# Patient Record
Sex: Female | Born: 1965 | Race: White | Hispanic: No | Marital: Single | Smoking: Former smoker
Health system: Southern US, Community
[De-identification: ages and names within clinical notes are randomized; demographics above are authoritative.]

## PROBLEM LIST (undated history)

## (undated) DIAGNOSIS — IMO0002 Reserved for concepts with insufficient information to code with codable children: Secondary | ICD-10-CM

## (undated) DIAGNOSIS — T8859XA Other complications of anesthesia, initial encounter: Secondary | ICD-10-CM

## (undated) DIAGNOSIS — K219 Gastro-esophageal reflux disease without esophagitis: Secondary | ICD-10-CM

## (undated) DIAGNOSIS — T4145XA Adverse effect of unspecified anesthetic, initial encounter: Secondary | ICD-10-CM

## (undated) DIAGNOSIS — R490 Dysphonia: Secondary | ICD-10-CM

## (undated) DIAGNOSIS — I82622 Acute embolism and thrombosis of deep veins of left upper extremity: Secondary | ICD-10-CM

## (undated) DIAGNOSIS — M329 Systemic lupus erythematosus, unspecified: Secondary | ICD-10-CM

## (undated) DIAGNOSIS — H547 Unspecified visual loss: Secondary | ICD-10-CM

## (undated) HISTORY — DX: Gastro-esophageal reflux disease without esophagitis: K21.9

## (undated) HISTORY — DX: Dysphonia: R49.0

## (undated) HISTORY — DX: Acute embolism and thrombosis of deep veins of left upper extremity: I82.622

---

## 2004-06-18 ENCOUNTER — Inpatient Hospital Stay (HOSPITAL_COMMUNITY): Admission: AD | Admit: 2004-06-18 | Discharge: 2004-06-18 | Payer: Self-pay | Admitting: Obstetrics & Gynecology

## 2004-06-22 ENCOUNTER — Inpatient Hospital Stay (HOSPITAL_COMMUNITY): Admission: AD | Admit: 2004-06-22 | Discharge: 2004-06-24 | Payer: Self-pay | Admitting: Obstetrics and Gynecology

## 2005-10-25 ENCOUNTER — Other Ambulatory Visit: Admission: RE | Admit: 2005-10-25 | Discharge: 2005-10-25 | Payer: Self-pay | Admitting: Obstetrics and Gynecology

## 2006-12-29 ENCOUNTER — Emergency Department (HOSPITAL_COMMUNITY): Admission: EM | Admit: 2006-12-29 | Discharge: 2006-12-29 | Payer: Self-pay | Admitting: Emergency Medicine

## 2011-11-26 ENCOUNTER — Ambulatory Visit (INDEPENDENT_AMBULATORY_CARE_PROVIDER_SITE_OTHER): Payer: BC Managed Care – PPO | Admitting: Internal Medicine

## 2011-11-26 ENCOUNTER — Encounter: Payer: Self-pay | Admitting: Internal Medicine

## 2011-11-26 VITALS — BP 110/80 | HR 97 | Temp 97.0°F | Ht 70.0 in | Wt 242.0 lb

## 2011-11-26 DIAGNOSIS — R059 Cough, unspecified: Secondary | ICD-10-CM

## 2011-11-26 DIAGNOSIS — R05 Cough: Secondary | ICD-10-CM

## 2011-11-26 LAB — PULMONARY FUNCTION TEST

## 2011-11-26 MED ORDER — PREDNISONE (PAK) 10 MG PO TABS: ORAL_TABLET | ORAL | Status: AC

## 2011-11-26 MED ORDER — ESOMEPRAZOLE MAGNESIUM 40 MG PO CPDR: 40.0000 mg | DELAYED_RELEASE_CAPSULE | Freq: Every day | ORAL | Status: DC

## 2011-11-26 MED ORDER — TRAMADOL HCL 50 MG PO TABS: ORAL_TABLET | ORAL | Status: AC

## 2011-11-26 NOTE — Progress Notes (Signed)
PFT done today. 

## 2011-11-26 NOTE — Progress Notes (Signed)
  Subjective:    Patient ID: Shelley Reese, female    DOB: 04/02/1966, 45 y.o.   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  11/26/2011  1st pulmonary evaluation for recurrent wheezing with colds  Onset in the 1990's rx with prednisone for maybe 10days,  No maint rx maybe a couple times a year plus abx and cough meds mostly in winter then moved to [place] in 2005 same problem s prednisone because wasn't wheezing but present exacerbation of cough started early November 2012 rx with advair / albuterol  p  "caught daughter's cold" zpak and levaquin never prednsione and never better.   Cough is daily,  dry,  More when  supine but also triggered   at [employer].  Started Zegrid otc no better.   Assoc with sense of nasal congestion but not itching or sneezing. Assoc with sence of throat fullness/ mild dysphagia. Not sob.    Review of Systems  Constitutional: Negative for fever, chills and unexpected weight change.  HENT: Positive for congestion, rhinorrhea, trouble swallowing and sinus pressure. Negative for ear pain, nosebleeds, sore throat, sneezing, dental problem, voice change and postnasal drip.   Eyes: Negative for visual disturbance.  Respiratory: Positive for cough. Negative for choking and shortness of breath.   Cardiovascular: Negative for chest pain and leg swelling.  Gastrointestinal: Negative for vomiting, abdominal pain and diarrhea.  Genitourinary: Negative for difficulty urinating.  Musculoskeletal: Positive for arthralgias.  Skin: Negative for rash.  Neurological: Negative for tremors, syncope and headaches.  Hematological: Does not bruise/bleed easily.       Objective:   Physical Exam  amb tense wf nad Wt 242 11/26/11 HEENT: nl dentition, turbinates, and orophanx.  Nl external ear canals without cough reflex   NECK :  without JVD/Nodes/TM/ nl carotid upstrokes bilaterally   LUNGS: no acc muscle use, clear to A and P bilaterally without cough on insp or exp maneuvers   CV:  RRR  no s3 or murmur or increase in P2, no edema   ABD:  soft and nontender with nl excursion in the supine position. No bruits or organomegaly, bowel sounds nl  MS:  warm without deformities, calf tenderness, cyanosis or clubbing  SKIN: warm and dry without lesions    NEURO:  alert, approp, no deficits   Outside cxr not available,  requested      Assessment & Plan:

## 2011-11-26 NOTE — Patient Instructions (Signed)
The key to effective treatment for your cough is eliminating the non-stop cycle of cough you're stuck in long enough to let your airway heal completely and then see if there is anything still making you cough once you stop the cough suppression, but this should take no more than 5 days to figuure out  First take delsym two tsp every 12 hours and supplement if needed with  tramadol 50 mg up to 2 every 4 hours to suppress the urge to cough. Swallowing water or using ice chips/non mint and menthol containing candies (such as lifesavers or sugarless jolly ranchers) are also effective.  You should rest your voice and avoid activities that you know make you cough.  Once you have eliminated the cough for 3 straight days try reducing the tramadol first,  then the delsym as tolerated.    Try Nexium 40  Take 30-60 min before first meal of the day and Zergrid 20 mg one bedtime until cough is completely gone for at least a week without the need for cough suppression  I think of reflux for chronic cough like I do oxygen for fire (doesn't cause the fire but once you get the oxygen suppressed it usually goes away regardless of the exact cause).  GERD (REFLUX)  is an extremely common cause of respiratory symptoms, many times with no significant heartburn at all.    It can be treated with medication, but also with lifestyle changes including avoidance of late meals, excessive alcohol, smoking cessation, and avoid fatty foods, chocolate, peppermint, colas, red wine, and acidic juices such as orange juice.  NO MINT OR MENTHOL PRODUCTS SO NO COUGH DROPS  USE SUGARLESS CANDY INSTEAD (jolley ranchers or Stover's)  NO OIL BASED VITAMINS - use powdered substitutes.    Prednisone 10 mg take  4 each am x 2 days,   2 each am x 2 days,  1 each am x2days and stop    Please schedule a follow up office visit in 2 weeks, sooner if needed with cxr in hand

## 2011-11-27 DIAGNOSIS — J45991 Cough variant asthma: Secondary | ICD-10-CM | POA: Insufficient documentation

## 2011-11-27 NOTE — Assessment & Plan Note (Addendum)
The most common causes of chronic cough in immunocompetent adults include the following: upper airway cough syndrome (UACS), previously referred to as postnasal drip syndrome (PNDS), which is caused by variety of rhinosinus conditions; (2) asthma; (3) GERD; (4) chronic bronchitis from cigarette smoking or other inhaled environmental irritants; (5) nonasthmatic eosinophilic bronchitis; and (6) bronchiectasis.   These conditions, singly or in combination, have accounted for up to 94% of the causes of chronic cough in prospective studies.   Other conditions have constituted no >6% of the causes in prospective studies These have included bronchogenic carcinoma, chronic interstitial pneumonia, sarcoidosis, left ventricular failure, ACEI-induced cough, and aspiration from a condition associated with pharyngeal dysfunction.  Of the three most common causes of chronic cough, only one (GERD)  can actually cause the other two (asthma and post nasal drip syndrome)  and perpetuate the cylce of cough inducing airway trauma, inflammation, heightened sensitivity to reflux which is prompted by the cough itself via a cyclical mechanism.    This may partially respond to steroids and look like asthma and post nasal drainage but never erradicated completely unless the cough and the secondary reflux are eliminated, preferably both at the same time.  While not intuitively obvious, many patients with chronic low grade reflux do not cough until there is a secondary insult that disturbs the protective epithelial barrier and exposes sensitive nerve endings.  This can be viral or direct physical injury such as with an endotracheal tube.   The point is that once this occurs, it is difficult to eliminate using anything but a maximally effective acid suppression regimen at least in the short run, accompanied by an appropriate diet to address non acid GERD.   See instructions for specific recommendations which were reviewed directly  with the patient who was given a copy with highlighter outlining the key components.   Very strongly doubt this is related to 9/11 based on the pattern of episodic nature of complaints that pre-dated 9/11 and didn't really change pattern afterwards

## 2011-12-06 ENCOUNTER — Encounter: Payer: Self-pay | Admitting: Internal Medicine

## 2011-12-07 ENCOUNTER — Encounter: Payer: Self-pay | Admitting: Internal Medicine

## 2011-12-10 ENCOUNTER — Ambulatory Visit (INDEPENDENT_AMBULATORY_CARE_PROVIDER_SITE_OTHER): Payer: BC Managed Care – PPO | Admitting: Internal Medicine

## 2011-12-10 ENCOUNTER — Encounter: Payer: Self-pay | Admitting: Internal Medicine

## 2011-12-10 VITALS — BP 150/88 | HR 75 | Temp 98.7°F | Ht 70.0 in | Wt 229.0 lb

## 2011-12-10 DIAGNOSIS — R059 Cough, unspecified: Secondary | ICD-10-CM

## 2011-12-10 DIAGNOSIS — R05 Cough: Secondary | ICD-10-CM

## 2011-12-10 NOTE — Patient Instructions (Addendum)
Consider stopping your hormone replacement for a month since this can contribute to GERD  Definitely continue nexium 40 mg Take 30-60 min before first meal of the day and Zegerid at bedtime for at least 3 months then defer to Dr Renae Gloss regarding longterm medical therapy or referral to a GI doctor   If the cough continues to be a problem next step asthma challenge ideally should not be done while on hormone replacement therapy because it causes false positives (as does reflux) but if you want to have this done call [facility] and schedule it at [phone].   Cxr should probably be repeated in a year unless symptoms worsen- return here if this is the case (exposure to 9/11 dust but no definite sequelae

## 2011-12-10 NOTE — Progress Notes (Signed)
Subjective:    Patient ID: Shelley Reese, female    DOB: Sep 26, 1966, 46 y.o.   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  11/26/2011  1st pulmonary evaluation for recurrent wheezing with colds  Onset in the 1990's rx with prednisone for maybe 10days,  No maint rx maybe a couple times a year plus abx and cough meds mostly in winter then moved to [place] in 2005 same problem s prednisone because wasn't wheezing but present exacerbation of cough started early November 2012 rx with advair / albuterol  p  "caught daughter's cold" zpak and levaquin never prednsione and never better.   Cough is daily,  dry,  More when  supine but also triggered   at [employer].  Started Zegrid otc no better.   Assoc with sense of nasal congestion but not itching or sneezing. Assoc with sence of throat fullness/ mild dysphagia. Not sob.    rec The key to effective treatment for your cough is eliminating the non-stop cycle of cough you're stuck in long enough to let your airway heal completely and then see if there is anything still making you cough once you stop the cough suppression, but this should take no more than 5 days to figuure out  First take delsym two tsp every 12 hours and supplement if needed with  tramadol 50 mg up to 2 every 4 hours to suppress the urge to cough..   Try Nexium 40  Take 30-60 min before first meal of the day and Zergrid 20 mg one bedtime until cough is completely gone for at least a week without the need for cough suppression Prednisone 10 mg take  4 each am x 2 days,   2 each am x 2 days,  1 each am x2days and stop       12/10/2011 f/u ov/[facility] cc cough much better, no use of suppression, still cough on exp to heavy fumes but this was true before Nov  2012 so feels completely  back to baseline now with no need for cough suppression, no sob  Sleeping ok without nocturnal  or early am exacerbation  of respiratory  c/o's or need for noct saba. Also denies any obvious fluctuation of symptoms with weather or environmental changes or other aggravating or alleviating factors except as outlined above   ROS  At present neg for  any significant sore throat, dysphagia, itching, sneezing,  nasal congestion or excess/ purulent secretions,  fever, chills, sweats, unintended wt loss, pleuritic or exertional cp, hempoptysis, orthopnea pnd or leg swelling.  Also denies presyncope, palpitations, heartburn, abdominal pain, nausea, vomiting, diarrhea  or change in bowel or urinary habits, dysuria,hematuria,  rash, arthralgias, visual complaints, headache, numbness weakness or ataxia.         Objective:   Physical Exam  amb tense somber wf nad Wt 242 11/26/11 >  12/10/2011  HEENT: nl dentition, turbinates, and orophanx. Nl external ear canals without cough reflex   NECK :  without JVD/Nodes/TM/ nl carotid upstrokes bilaterally   LUNGS: no acc muscle use, clear to A and P bilaterally without cough on insp or exp maneuvers   CV:  RRR  no s3 or murmur or increase in P2, no edema  ABD:  soft and nontender with nl excursion in the supine position. No bruits or organomegaly, bowel sounds nl  MS:  warm without deformities, calf tenderness, cyanosis or clubbing  SKIN: warm and dry without lesions         CXR 10/23/11 Minimal increase in markings ? significant      Assessment & Plan:

## 2011-12-11 NOTE — Assessment & Plan Note (Addendum)
-   Exposed to 08/16/00 dust but no chronic symptoms   - PFT's 11/26/11 WNL x dlco 69 % corrects to 123  Resolved at present so probably developed a typical cyclical cough as a result of what should have been just a viral uri  Explained natural history of uri and why it's necessary in patients at risk to treat GERD aggressively  at least  short term   to reduce risk of evolving cyclical cough initially  triggered by epithelial injury and a heightened sensitivty to the effects of any upper airway irritants,  most importantly acid - related.  That is, the more sensitive the epithelium damaged for virus, the more the cough, the more the secondary reflux (especially in those prone to reflux) the more the irritation of the sensitive mucosa and so on in a cyclical pattern.   No further w/u needed

## 2011-12-29 ENCOUNTER — Encounter: Payer: Self-pay | Admitting: Internal Medicine

## 2012-12-15 ENCOUNTER — Other Ambulatory Visit (HOSPITAL_COMMUNITY): Payer: Self-pay | Admitting: Cardiovascular Disease

## 2012-12-15 DIAGNOSIS — R079 Chest pain, unspecified: Secondary | ICD-10-CM

## 2012-12-15 DIAGNOSIS — R0600 Dyspnea, unspecified: Secondary | ICD-10-CM

## 2012-12-19 ENCOUNTER — Ambulatory Visit (HOSPITAL_COMMUNITY)
Admission: RE | Admit: 2012-12-19 | Discharge: 2012-12-19 | Disposition: A | Discharge status: Home / Self Care | Payer: BC Managed Care – PPO | Source: Ambulatory Visit | Attending: Cardiovascular Disease | Admitting: Cardiovascular Disease

## 2012-12-19 DIAGNOSIS — R0600 Dyspnea, unspecified: Secondary | ICD-10-CM

## 2012-12-19 DIAGNOSIS — R079 Chest pain, unspecified: Secondary | ICD-10-CM | POA: Insufficient documentation

## 2012-12-19 DIAGNOSIS — R0989 Other specified symptoms and signs involving the circulatory and respiratory systems: Secondary | ICD-10-CM | POA: Insufficient documentation

## 2012-12-19 DIAGNOSIS — R0609 Other forms of dyspnea: Secondary | ICD-10-CM | POA: Insufficient documentation

## 2012-12-19 MED ORDER — REGADENOSON 0.4 MG/5ML IV SOLN
0.4000 mg | Freq: Once | INTRAVENOUS | Status: AC
  Administered 2012-12-19: 0.4 mg via INTRAVENOUS

## 2012-12-19 MED ORDER — TECHNETIUM TC 99M SESTAMIBI GENERIC - CARDIOLITE
31.0000 | Freq: Once | INTRAVENOUS | Status: AC | PRN
  Administered 2012-12-19: 31 via INTRAVENOUS

## 2012-12-19 MED ORDER — TECHNETIUM TC 99M SESTAMIBI GENERIC - CARDIOLITE
10.4000 | Freq: Once | INTRAVENOUS | Status: AC | PRN
  Administered 2012-12-19: 10 via INTRAVENOUS

## 2012-12-19 NOTE — Procedures (Addendum)
[facility] [address] [place] [address] [place] [address] [phone]  Cardiology Nuclear Med Study  Shelley Reese is a 47 y.o. female     MRN : [id number]     DOB: July 07, 1966  Procedure Date: 12/19/2012  Nuclear Med Background Indication for Stress Test:  Evaluation for Ischemia History:  no prior cardiac history Cardiac Risk Factors: Family History - CAD, History of Smoking, Hypertension and Obesity  Symptoms:  Chest Pain and DOE   Nuclear Pre-Procedure Caffeine/Decaff Intake:  7:00pm NPO After: 5:00am   IV Site: R Antecubital  IV 0.9% NS with Angio Cath:  22g  Chest Size (in):  n/a IV Started by: Koren Shiver, CNMT  Height: 5\' 10"  (1.778 m)  Cup Size: c  BMI:  Body mass index is 33.86 kg/(m^2). Weight:  236 lb (107.049 kg)   Tech Comments:  n/a    Nuclear Med Study 1 or 2 day study: 1 day  Stress Test Type:  Lexiscan  Order Authorizing Provider:  Nanetta Batty, MD   Resting Radionuclide: Technetium 1m Sestamibi  Resting Radionuclide Dose: 10.4 mCi   Stress Radionuclide:  Technetium 54m Sestamibi  Stress Radionuclide Dose: 31.0 mCi           Stress Protocol Rest HR: 74 Stress HR: 115  Rest BP: 134/82 Stress BP: 146/87  Exercise Time (min): n/a METS: n/a   Predicted Max HR: 174 bpm % Max HR: 66.09 bpm Rate Pressure Product: 57846   Dose of Adenosine (mg):  n/a Dose of Lexiscan: 0.4 mg  Dose of Atropine (mg): n/a Dose of Dobutamine: n/a mcg/kg/min (at max HR)  Stress Test Technologist: Esperanza Sheets, CCT Nuclear Technologist: Gonzella Lex, CNMT   Rest Procedure:  Myocardial perfusion imaging was performed at rest 45 minutes following the intravenous administration of Technetium 73m Sestamibi. Stress Procedure:  The patient received IV Lexiscan 0.4 mg over 15-seconds.  Technetium 34m Sestamibi injected at 30-seconds.  There were no significant changes with Lexiscan.  Quantitative spect images were obtained after a  45 minute delay.  Transient Ischemic Dilatation (Normal <1.22):  0.87 Lung/Heart Ratio (Normal <0.45):  0.25 QGS EDV:  86 ml QGS ESV:  26 ml LV Ejection Fraction: 70%     Rest ECG: NSR - Normal EKG  Stress ECG: No significant change from baseline ECG  QPS Raw Data Images:  Normal; no motion artifact; normal heart/lung ratio. Stress Images:  Normal homogeneous uptake in all areas of the myocardium. Rest Images:  Normal homogeneous uptake in all areas of the myocardium. Subtraction (SDS):  No evidence of ischemia.  Impression Exercise Capacity:  Lexiscan with no exercise. BP Response:  Normal blood pressure response. Clinical Symptoms:  No significant symptoms noted. ECG Impression:  No significant ST segment change suggestive of ischemia. Comparison with Prior Nuclear Study: No significant change from previous study  Overall Impression:  Normal stress nuclear study.  LV Wall Motion:  NL LV Function; NL Wall Motion   Shelley Rushlow, MD  12/19/2012 12:39 PM

## 2013-04-07 ENCOUNTER — Encounter: Payer: Self-pay | Admitting: Cardiovascular Disease

## 2013-10-11 ENCOUNTER — Other Ambulatory Visit: Payer: Self-pay

## 2014-09-20 ENCOUNTER — Other Ambulatory Visit: Payer: Self-pay

## 2015-06-16 ENCOUNTER — Encounter: Payer: Self-pay | Admitting: *Deleted

## 2015-07-09 ENCOUNTER — Encounter: Payer: Self-pay | Admitting: Cardiovascular Disease

## 2015-07-23 ENCOUNTER — Other Ambulatory Visit: Payer: Self-pay | Admitting: Obstetrics and Gynecology

## 2015-07-23 DIAGNOSIS — R928 Other abnormal and inconclusive findings on diagnostic imaging of breast: Secondary | ICD-10-CM

## 2015-07-29 ENCOUNTER — Ambulatory Visit
Admission: RE | Admit: 2015-07-29 | Discharge: 2015-07-29 | Disposition: A | Discharge status: Home / Self Care | Payer: No Typology Code available for payment source | Source: Ambulatory Visit | Attending: Obstetrics and Gynecology | Admitting: Obstetrics and Gynecology

## 2015-07-29 DIAGNOSIS — R928 Other abnormal and inconclusive findings on diagnostic imaging of breast: Secondary | ICD-10-CM

## 2016-09-30 ENCOUNTER — Other Ambulatory Visit: Payer: Self-pay | Admitting: Family

## 2016-09-30 ENCOUNTER — Ambulatory Visit
Admission: RE | Admit: 2016-09-30 | Discharge: 2016-09-30 | Disposition: A | Discharge status: Home / Self Care | Payer: No Typology Code available for payment source | Source: Ambulatory Visit | Attending: Family | Admitting: Family

## 2016-09-30 DIAGNOSIS — R059 Cough, unspecified: Secondary | ICD-10-CM

## 2016-09-30 DIAGNOSIS — R05 Cough: Secondary | ICD-10-CM

## 2016-10-06 ENCOUNTER — Ambulatory Visit (INDEPENDENT_AMBULATORY_CARE_PROVIDER_SITE_OTHER): Payer: Self-pay | Admitting: Internal Medicine

## 2016-10-06 ENCOUNTER — Encounter: Payer: Self-pay | Admitting: Internal Medicine

## 2016-10-06 VITALS — BP 132/86 | HR 91 | Ht 68.5 in | Wt 220.0 lb

## 2016-10-06 DIAGNOSIS — R059 Cough, unspecified: Secondary | ICD-10-CM

## 2016-10-06 DIAGNOSIS — R05 Cough: Secondary | ICD-10-CM

## 2016-10-06 MED ORDER — CEFDINIR 300 MG PO CAPS: 300.0000 mg | ORAL_CAPSULE | Freq: Two times a day (BID) | ORAL | 0 refills | Status: DC

## 2016-10-06 NOTE — Progress Notes (Signed)
Subjective:    Patient ID: Shelley Reese, female    DOB: 07/25/1966,    MRN: [id number]  HPI   [age]  yowf TA/ kindergarden light smoker quit  2004   [place] no allergies or asthma occ bad cough about half the time needed to see doctor to get over it, referred by Dr [name] for eval of dust exp after 9/11 attacks in 2001 with - PFT's 11/26/11 WNL x dlco 69 % corrects to 123   11/26/2011  1st pulmonary evaluation for recurrent wheezing with colds  Onset in the 1990's rx with prednisone for maybe 10days,  No maint rx maybe a couple times a year plus abx and cough meds mostly in winter then moved to [place] in 2005 same problem s prednisone because wasn't wheezing but present exacerbation of cough started early November 2012 rx with advair / albuterol  p  "caught daughter's cold" zpak and levaquin never prednsione and never better.   Cough is daily,  dry,  More when  supine but also triggered   at [employer].  Started Zegrid otc no better.  Assoc with sense of nasal congestion but not itching or sneezing. Assoc with sence of throat fullness/ mild dysphagia. Not sob.  rec The key to effective treatment for your cough is eliminating the non-stop cycle of cough you're stuck in long enough to let your airway heal completely and then see if there is anything still making you cough once you stop the cough suppression, but this should take no more than 5 days to figuure out First take delsym two tsp every 12 hours and supplement if needed with  tramadol 50 mg up to 2 every 4 hours to suppress the urge to cough..   Try Nexium 40  Take 30-60 min before first meal of the day and Zergrid 20 mg one bedtime until cough is completely gone for at least a week without the need for cough suppression Prednisone 10 mg take  4 each am x 2 days,   2 each am x 2 days,  1 each am x2days and stop       12/10/2011 f/u ov/[facility] cc cough much better, no use of suppression, still cough on exp to heavy fumes but this  was true before Nov  2012 so feels completely back to baseline now with no need for cough suppression, no sob rec Consider stopping your hormone replacement for a month since this can contribute to GERD Definitely continue nexium 40 mg Take 30-60 min before first meal of the day and Zegerid at bedtime for at least 3 months then defer to Dr [name] regarding longterm medical therapy or referral to a GI doctor  If the cough continues to be a problem next step asthma challenge ideally should not be done while on hormone replacement therapy because it causes false positives (as does reflux) but if you want to have this done call [facility] and schedule it at [phone]> never done .        10/06/2016 1st [facility] office visit/ Shelley Reese   Chief Complaint  Patient presents with  . Pulmonary Consult    Referred by Boneta LucksJennifer Brown, PA. Pt c/o cough off and on for the past year. She states she has had PNA several times in 2017. She states her cough is occ prod with yellow to green sputum. She coughs until she vomits.   last abx x one month prior to OV then sick again with  severe cough / green mucus and cough to vomiting  Sob mostly when coughing/ her blue inhaler helps some (dulera)  No obvious day to day or daytime variability or assoc  mucus plugs or hemoptysis or cp or chest tightness, subjective wheeze or overt sinus or hb symptoms. No unusual exp hx or h/o childhood pna/ asthma or knowledge of premature birth.  Sleeping ok without nocturnal  or early am exacerbation  of respiratory  c/o's or need for noct saba. Also denies any obvious fluctuation of symptoms with weather or environmental changes or other aggravating or alleviating factors except as outlined above   Current Medications, Allergies, Complete Past Medical History, Past Surgical History, Family History, and Social History were reviewed in [facility] electronic medical record.      Review of Systems  Constitutional: Negative for  chills, fever and unexpected weight change.  HENT: Positive for sneezing, trouble swallowing and voice change. Negative for congestion, dental problem, ear pain, nosebleeds, postnasal drip, rhinorrhea, sinus pressure and sore throat.   Eyes: Negative for visual disturbance.  Respiratory: Positive for cough and shortness of breath. Negative for choking.   Cardiovascular: Positive for chest pain. Negative for leg swelling.  Gastrointestinal: Positive for vomiting. Negative for abdominal pain and diarrhea.  Genitourinary: Negative for difficulty urinating.  Musculoskeletal: Negative for arthralgias.  Skin: Negative for rash.  Neurological: Negative for tremors, syncope and headaches.  Hematological: Does not bruise/bleed easily.       Objective:   Physical Exam  Wt Readings from Last 3 Encounters:  10/06/16 220 lb (99.8 kg)  12/19/12 236 lb (107 kg)  12/10/11 229 lb (103.9 kg)    Vital signs reviewed - Note on arrival 02 sats  96% on RA    HEENT: nl dentition, turbinates, and oropharynx. Nl external ear canals without cough reflex   NECK :  without JVD/Nodes/TM/ nl carotid upstrokes bilaterally   LUNGS: no acc muscle use,  Nl contour chest with insp and exp sonorous rhonchi bilaterally   CV:  RRR  no s3 or murmur or increase in P2, no edema   ABD:  soft and nontender with nl inspiratory excursion in the supine position. No bruits or organomegaly, bowel sounds nl  MS:  Nl gait/ ext warm without deformities, calf tenderness, cyanosis or clubbing No obvious joint restrictions   SKIN: warm and dry without lesions    NEURO:  alert, approp, nl sensorium with  no motor deficits     I personally reviewed images and agree with radiology impression as follows:  CXR:   09/30/16  The heart size and mediastinal contours are within normal limits. There is no focal infiltrate, pulmonary edema, or pleural effusion. The visualized skeletal structures are unremarkable.         Assessment & Plan:

## 2016-10-06 NOTE — Patient Instructions (Addendum)
omnicef 300 mg one twice daily x 10 days   Best cough med  > mucined dm up to 1200 mg every 12 hours as needed  For wheeze > dulera up to 2 puff every 12 hours as needed   GERD (REFLUX)  is an extremely common cause of respiratory symptoms just like yours , many times with no obvious heartburn at all.    It can be treated with medication, but also with lifestyle changes including elevation of the head of your bed (ideally with 6 inch  bed blocks),  Smoking cessation, avoidance of late meals, excessive alcohol, and avoid fatty foods, chocolate, peppermint, colas, red wine, and acidic juices such as orange juice.  NO MINT OR MENTHOL PRODUCTS SO NO COUGH DROPS  USE SUGARLESS CANDY INSTEAD (Jolley ranchers or Stover's or Life Savers) or even ice chips will also do - the key is to swallow to prevent all throat clearing. NO OIL BASED VITAMINS - use powdered substitutes.  After the first of the year you will need a sinus ct and high resolution ct chest - call to schedule  Also need full pneumonia vaccination per your primary Care provider

## 2016-10-07 NOTE — Assessment & Plan Note (Signed)
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 and would benefit from full immunization for flu/ pneumovax but defer this to [facility]ennifer Brown  She needs a w/u with CT sinus and HRCT chest (looking from bronchiectasis) but has no insurance and this can wait until after the first of the year when she will  In meantime rec omnicef/ mucinex dm/ dulera 2 bid until better then try off   Total time devoted to counseling  = 20/5640m review case with pt/ discussion of options/alternatives/ personally creating written instructions  in presence of pt  then going over those specific  Instructions directly with the pt including how to use all of the meds but in particular covering each new medication in detail and the difference between the maintenance/automatic meds and the prns using an action plan format for the latter.

## 2017-01-10 ENCOUNTER — Ambulatory Visit (INDEPENDENT_AMBULATORY_CARE_PROVIDER_SITE_OTHER): Payer: BC Managed Care – PPO | Admitting: Oncology

## 2017-01-10 ENCOUNTER — Encounter: Payer: Self-pay | Admitting: Oncology

## 2017-01-10 VITALS — BP 131/82 | HR 86 | Temp 97.8°F | Ht 69.5 in | Wt 219.8 lb

## 2017-01-10 DIAGNOSIS — Z87891 Personal history of nicotine dependence: Secondary | ICD-10-CM

## 2017-01-10 DIAGNOSIS — Z820 Family history of epilepsy and other diseases of the nervous system: Secondary | ICD-10-CM | POA: Diagnosis not present

## 2017-01-10 DIAGNOSIS — D649 Anemia, unspecified: Secondary | ICD-10-CM

## 2017-01-10 DIAGNOSIS — Z800 Family history of malignant neoplasm of digestive organs: Secondary | ICD-10-CM

## 2017-01-10 DIAGNOSIS — Z801 Family history of malignant neoplasm of trachea, bronchus and lung: Secondary | ICD-10-CM

## 2017-01-10 DIAGNOSIS — D72829 Elevated white blood cell count, unspecified: Secondary | ICD-10-CM

## 2017-01-10 DIAGNOSIS — I82622 Acute embolism and thrombosis of deep veins of left upper extremity: Secondary | ICD-10-CM | POA: Insufficient documentation

## 2017-01-10 DIAGNOSIS — Z8249 Family history of ischemic heart disease and other diseases of the circulatory system: Secondary | ICD-10-CM

## 2017-01-10 DIAGNOSIS — Z881 Allergy status to other antibiotic agents status: Secondary | ICD-10-CM

## 2017-01-10 DIAGNOSIS — R634 Abnormal weight loss: Secondary | ICD-10-CM | POA: Diagnosis not present

## 2017-01-10 DIAGNOSIS — Z807 Family history of other malignant neoplasms of lymphoid, hematopoietic and related tissues: Secondary | ICD-10-CM

## 2017-01-10 DIAGNOSIS — R490 Dysphonia: Secondary | ICD-10-CM

## 2017-01-10 DIAGNOSIS — Z803 Family history of malignant neoplasm of breast: Secondary | ICD-10-CM

## 2017-01-10 DIAGNOSIS — Z8269 Family history of other diseases of the musculoskeletal system and connective tissue: Secondary | ICD-10-CM | POA: Diagnosis not present

## 2017-01-10 DIAGNOSIS — R05 Cough: Secondary | ICD-10-CM | POA: Diagnosis not present

## 2017-01-10 DIAGNOSIS — Z836 Family history of other diseases of the respiratory system: Secondary | ICD-10-CM

## 2017-01-10 DIAGNOSIS — Z808 Family history of malignant neoplasm of other organs or systems: Secondary | ICD-10-CM

## 2017-01-10 HISTORY — DX: Dysphonia: R49.0

## 2017-01-10 HISTORY — DX: Acute embolism and thrombosis of deep veins of left upper extremity: I82.622

## 2017-01-10 LAB — CBC WITH DIFFERENTIAL/PLATELET
BASOS PCT: 1 %
Basophils Absolute: 0.1 10*3/uL (ref 0.0–0.1)
EOS ABS: 0.3 10*3/uL (ref 0.0–0.7)
Eosinophils Relative: 2 %
HEMATOCRIT: 37.7 % (ref 36.0–46.0)
Hemoglobin: 12 g/dL (ref 12.0–15.0)
Lymphocytes Relative: 23 %
Lymphs Abs: 2.4 10*3/uL (ref 0.7–4.0)
MCH: 26 pg (ref 26.0–34.0)
MCHC: 31.8 g/dL (ref 30.0–36.0)
MCV: 81.6 fL (ref 78.0–100.0)
MONO ABS: 0.7 10*3/uL (ref 0.1–1.0)
MONOS PCT: 7 %
NEUTROS ABS: 7.2 10*3/uL (ref 1.7–7.7)
Neutrophils Relative %: 67 %
Platelets: 393 10*3/uL (ref 150–400)
RBC: 4.62 MIL/uL (ref 3.87–5.11)
RDW: 14.2 % (ref 11.5–15.5)
WBC: 10.7 10*3/uL — ABNORMAL HIGH (ref 4.0–10.5)

## 2017-01-10 LAB — COMPREHENSIVE METABOLIC PANEL
ALBUMIN: 4.1 g/dL (ref 3.5–5.0)
ALK PHOS: 71 U/L (ref 38–126)
ALT: 16 U/L (ref 14–54)
AST: 17 U/L (ref 15–41)
Anion gap: 10 (ref 5–15)
BUN: 6 mg/dL (ref 6–20)
CALCIUM: 9.3 mg/dL (ref 8.9–10.3)
CO2: 26 mmol/L (ref 22–32)
CREATININE: 0.65 mg/dL (ref 0.44–1.00)
Chloride: 103 mmol/L (ref 101–111)
GFR calc Af Amer: >60 mL/min (ref >60)
GFR calc non Af Amer: >60 mL/min (ref >60)
GLUCOSE: 94 mg/dL (ref 65–99)
Potassium: 3.7 mmol/L (ref 3.5–5.1)
SODIUM: 139 mmol/L (ref 135–145)
Total Bilirubin: 0.5 mg/dL (ref 0.3–1.2)
Total Protein: 7.3 g/dL (ref 6.5–8.1)

## 2017-01-10 LAB — LACTATE DEHYDROGENASE: LDH: 112 U/L (ref 98–192)

## 2017-01-10 LAB — D-DIMER, QUANTITATIVE: D-Dimer, Quant: <0.27 ug/mL-FEU (ref 0.00–0.50)

## 2017-01-10 NOTE — Patient Instructions (Signed)
Pre-authorization then schedule CT chest/venogram Referral to ENT Dr Narda Bondshris Newman to evaluate hoarse voice Continue Xarelto  Return visit 4-6 weeks

## 2017-01-10 NOTE — Progress Notes (Signed)
New Patient Hematology   Shelley Reese [id number] 1965/12/10 51 y.o. 01/10/2017  CC: Boneta Lucks, PA, Casper Harrison; Dr. Sandrea Hughs; Dr. Carrington Clamp; Dr. Charna Elizabeth   Reason for referral: Evaluate unprovoked extensive left upper extremity DVT   HPI:  51 year old woman who grew up in [place] then moved to [place], [place] where she had a successful career as a Materials engineer. She went to high school with Dr.Aluisio. 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 Right around Thanksgiving, she developed acute onset of pain after lifting up a school desk which had fallen over. Pain persisted and then her left upper arm began to swell. Her primary caregiver evaluated her and was concerned that she may have had a DVT. She was sent for Doppler study on 11/04/2016 which showed extensive DVT in the left venous system including the basilic, cephalic, brachial, subclavian, and internal jugular veins. She was started on several to anticoagulation. She reports coincidentally feeling much better overall. She states that her menstrual cycle was heavy the first month on the Xarelto but now it has stopped completely. She has had a chronic cough, worse in the supine position, some of this has been attributed to toxic dust inhalation since she was working in [place] at the time of the [employer]. She has been evaluated by Dr. Sandrea Hughs, pulmonology. He saw her in November 2017. Chest x-ray done 09/30/2016 was normal. He planned to get a CT scan early this year when the patient got her medical insurance back. Over the last year she has noted a change in her voice with increasing hoarseness. She does have a history of reflux esophagitis.  In view of a strong family history of colon cancer, she has been followed closely and has had 3 colonoscopies to date the last one done about 3 years ago. She also had upper endoscopy with the  biopsy? Excision? Of an abnormality in her esophagus. She has a follow-up study scheduled for this April. She denies any current dysphagia. She does report a 20 pound weight loss over the last 3-4 months. Last mammogram was done 07/29/2015. A "intra-mammary/low axillary lymph node" seen in the left breast but not felt to be malignant appearing and an annual follow-up study was recommended. She had a breast biopsy on the right side 18 years ago which was benign. She was recently evaluated for polymyalgias. She was concerned in view of her mother's history of severe lupus. She did see a rheumatologist and was told that she had rheumatoid arthritis.  She was on an estrogen containing oral contraceptive for over 10 years to help regulate her menses. This was stopped at the time of the recent DVT.  Her father is still alive at age 71. He has Paget's disease. No clotting problems. Her mother died at age 73. 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 Her mother had a history of DVTs and possibly pulmonary emboli and may have had a caval filter placed. She has a sister in [place] and a brother and [place] both younger than her who are healthy and have had no clotting issues.    PMH: Past Medical History:  Diagnosis Date  . Acute deep vein thrombosis (DVT) of ulnar vein of left upper extremity (HCC) 01/10/2017   11/04/16 all veins from  basilic/cephalic up through internal jugular; unprovoked  . Asthma   . GERD (gastroesophageal reflux disease)   . Hoarseness of voice 01/10/2017   X1 year; reported at visit 01/10/17  No hypertension, no ulcers, she denied TB, emphysema,. No thyroid disease. No history of hepatitis or yellow jaundice. No mononucleosis. No seizure or stroke. 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 Possible inflammatory arthritis see history  of present illness. No major surgery.  Allergies: Allergies  Allergen Reactions  . Amoxicillin     GI Upset    Medications:  Current Outpatient Prescriptions:  .  dexlansoprazole (DEXILANT) 60 MG capsule, Take 60 mg by mouth daily., Disp: , Rfl:  .  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 Single parent. Never married. Healthy daughter age 51.  she quit smoking about 14 years ago. . She smoked about 1 pack per month. She has never used smokeless tobacco.  she drinks an occasional glass of wine. she does not use drugs.  Family History: Family History  Problem Relation Age of Onset  . Heart disease Father   . Heart disease Mother   . Deep vein thrombosis Mother   . Lung cancer Mother     used to smoke, exposure to asbestos  . Mitral valve prolapse Mother   . Lupus Mother   . Allergies Brother   . Allergies Sister   . Colon cancer Paternal Grandmother   . Colon cancer Maternal Grandmother   . Liver cancer Maternal Grandmother   . Bone cancer Maternal Aunt   . Breast cancer Paternal Aunt     Review of Systems: See history of present illness Remaining ROS negative.  Physical Exam: Blood pressure 131/82, pulse 86, temperature 97.8 F (36.6 C), temperature source Oral, height 5' 9.5" (1.765 m), weight 219 lb 12.8 oz (99.7 kg), last menstrual period 11/04/2016, SpO2 99 %. Wt Readings from Last 3 Encounters:  01/10/17 219 lb 12.8 oz (99.7 kg)  10/06/16 220 lb (99.8 kg)  12/19/12 236 lb (107 kg)     General appearance: Well-nourished Caucasian woman HENNT: Pharynx no erythema, exudate, mass, or ulcer. No thyromegaly or thyroid nodules Lymph nodes: No cervical, supraclavicular, or axillary lymphadenopathy Breasts: No abnormal skin changes, no dominant mass in either breast Lungs: Clear to auscultation, resonant to percussion throughout Heart: Regular rhythm, no  murmur, no gallop, no rub, no click, no edema Abdomen: Soft, nontender, normal bowel sounds, no mass, no organomegaly Extremities: Mild swelling of the left upper extremity, minimal superficial venous distention medial aspect proximal left arm and even less pronounced over the left pectoral region, distal extremities with no calf tenderness or swelling. Circumference proximal left arm 38.5 cm, right 36 cm Circumference at the wrist: 20.5 cm on the left, 18.5 cm on the right Musculoskeletal: no joint deformities GU: Pelvic exam and Pap smear done March 2017 reported to her as negative Vascular: Carotid pulses 2+, no bruits, Neurologic: Alert, oriented, PERRLA, optic discs sharp and vessels normal, no hemorrhage or exudate, cranial nerves grossly normal, motor strength 5 over 5, reflexes 1+ symmetric, upper body coordination normal, gait normal, Skin: No rash or ecchymosis; she is nervous and her skin is flushed.    Lab Results: Lab Results  Component Value Date   WBC 10.7 (H) 01/10/2017   HGB 12.0 01/10/2017   HCT 37.7 01/10/2017   MCV 81.6 01/10/2017   PLT 393 01/10/2017  Chemistry      Component Value Date/Time   NA 139 01/10/2017 1705   K 3.7 01/10/2017 1705   CL 103 01/10/2017 1705   CO2 26 01/10/2017 1705   BUN 6 01/10/2017 1705   CREATININE 0.65 01/10/2017 1705      Component Value Date/Time   CALCIUM 9.3 01/10/2017 1705   ALKPHOS 71 01/10/2017 1705   AST 17 01/10/2017 1705   ALT 16 01/10/2017 1705   BILITOT 0.5 01/10/2017 1705      Radiological Studies: Normal chest x-ray   Impression: Extensive, unprovoked, left upper extremity DVT.  Although this occurred while she was on an estrogen containing preparation, she has a number of findings that obligate an evaluation to exclude occult malignancy including: Recent 20 pound weight loss, development of a hoarse voice, persistent cough, borderline anemia and leukocytosis. No obvious suspicion for a thoracic outlet  syndrome. No cervical rib noted on chest x-ray.  Recommendation: I will get a CT chest and CT or MR venogram after discussion with radiology. I am making a here notes and throat surgery referral to Dr. Narda Bonds to evaluate her hoarse voice. She is encouraged to keep her follow-up appointments for GI endoscopy with Dr. Loreta Ave in April. I do not think that she needs extensive thrombophilia testing. We discussed the fact that given the unprovoked nature of this event, she needs to be on extended anticoagulation with periodic reevaluation.     Cephas Darby, MD, FACP  Hematology-Oncology/Internal Medicine  01/10/2017, 6:46 PM

## 2017-01-11 ENCOUNTER — Other Ambulatory Visit: Payer: Self-pay | Admitting: Oncology

## 2017-01-11 DIAGNOSIS — I82622 Acute embolism and thrombosis of deep veins of left upper extremity: Secondary | ICD-10-CM

## 2017-01-13 ENCOUNTER — Telehealth: Payer: Self-pay | Admitting: *Deleted

## 2017-01-13 NOTE — Telephone Encounter (Signed)
-----   Message from Levert FeinsteinJames M Granfortuna, MD sent at 01/11/2017  5:29 PM EST ----- Call pt: liver & kidney function normal. Hemoglobin (red cell count) low normal. White blood count borderline elevated - non-specific; we are working on pre-authorization for the chest CT; I gave [place] patient Prospero Angiolino your home phone - he would like to invite your dad to the [place] get [place] - [place] it [place] [employer] [place].

## 2017-01-13 NOTE — Telephone Encounter (Signed)
Called pt - no answer; left message "liver & kidney function normal. Hemoglobin (red cell count) low normal (@12 .0). White blood count borderline elevated - non-specific(@10 .7); we are working on pre-authorization for the chest CT; I gave my [place] patient Prospero Angiolino your home phone - he would like to invite your dad to the [place] get [place] - they [place] it at a [employer] [place]. " per Dr Cyndie ChimeGranfortuna. And call for any questions.

## 2017-01-19 ENCOUNTER — Other Ambulatory Visit: Payer: Self-pay | Admitting: Oncology

## 2017-01-19 ENCOUNTER — Encounter (HOSPITAL_COMMUNITY): Payer: Self-pay

## 2017-01-19 ENCOUNTER — Ambulatory Visit (HOSPITAL_COMMUNITY)
Admission: RE | Admit: 2017-01-19 | Discharge: 2017-01-19 | Disposition: A | Discharge status: Home / Self Care | Payer: BC Managed Care – PPO | Source: Ambulatory Visit | Attending: Oncology | Admitting: Oncology

## 2017-01-19 ENCOUNTER — Ambulatory Visit (HOSPITAL_COMMUNITY): Payer: BC Managed Care – PPO

## 2017-01-19 DIAGNOSIS — I82622 Acute embolism and thrombosis of deep veins of left upper extremity: Secondary | ICD-10-CM

## 2017-01-19 DIAGNOSIS — R490 Dysphonia: Secondary | ICD-10-CM

## 2017-01-19 DIAGNOSIS — R059 Cough, unspecified: Secondary | ICD-10-CM

## 2017-01-19 DIAGNOSIS — R634 Abnormal weight loss: Secondary | ICD-10-CM

## 2017-01-19 DIAGNOSIS — R05 Cough: Secondary | ICD-10-CM

## 2017-01-19 DIAGNOSIS — D649 Anemia, unspecified: Secondary | ICD-10-CM

## 2017-01-27 ENCOUNTER — Ambulatory Visit (HOSPITAL_COMMUNITY): Admission: RE | Admit: 2017-01-27 | Payer: BC Managed Care – PPO | Source: Ambulatory Visit

## 2017-01-28 ENCOUNTER — Ambulatory Visit (HOSPITAL_COMMUNITY)
Admission: RE | Admit: 2017-01-28 | Discharge: 2017-01-28 | Disposition: A | Discharge status: Home / Self Care | Payer: BC Managed Care – PPO | Source: Ambulatory Visit | Attending: Oncology | Admitting: Oncology

## 2017-01-28 DIAGNOSIS — K228 Other specified diseases of esophagus: Secondary | ICD-10-CM | POA: Diagnosis not present

## 2017-01-28 DIAGNOSIS — I82622 Acute embolism and thrombosis of deep veins of left upper extremity: Secondary | ICD-10-CM | POA: Insufficient documentation

## 2017-01-28 DIAGNOSIS — R918 Other nonspecific abnormal finding of lung field: Secondary | ICD-10-CM | POA: Insufficient documentation

## 2017-01-28 MED ORDER — IOPAMIDOL (ISOVUE-300) INJECTION 61%
75.0000 mL | Freq: Once | INTRAVENOUS | Status: AC | PRN
  Administered 2017-01-28: 150 mL via INTRAVENOUS

## 2017-01-28 MED ORDER — IOPAMIDOL (ISOVUE-300) INJECTION 61%
INTRAVENOUS | Status: AC
  Filled 2017-01-28: qty 150

## 2017-01-31 ENCOUNTER — Ambulatory Visit (HOSPITAL_COMMUNITY): Payer: BC Managed Care – PPO

## 2017-01-31 ENCOUNTER — Encounter: Payer: Self-pay | Admitting: Oncology

## 2017-02-11 ENCOUNTER — Encounter: Payer: Self-pay | Admitting: Internal Medicine

## 2017-02-11 ENCOUNTER — Ambulatory Visit (INDEPENDENT_AMBULATORY_CARE_PROVIDER_SITE_OTHER): Payer: BC Managed Care – PPO | Admitting: Internal Medicine

## 2017-02-11 ENCOUNTER — Other Ambulatory Visit (INDEPENDENT_AMBULATORY_CARE_PROVIDER_SITE_OTHER): Payer: BC Managed Care – PPO

## 2017-02-11 VITALS — BP 124/82 | HR 74 | Ht 69.5 in | Wt 218.4 lb

## 2017-02-11 DIAGNOSIS — R918 Other nonspecific abnormal finding of lung field: Secondary | ICD-10-CM | POA: Diagnosis not present

## 2017-02-11 DIAGNOSIS — J45991 Cough variant asthma: Secondary | ICD-10-CM

## 2017-02-11 DIAGNOSIS — R059 Cough, unspecified: Secondary | ICD-10-CM

## 2017-02-11 DIAGNOSIS — R05 Cough: Secondary | ICD-10-CM

## 2017-02-11 LAB — SEDIMENTATION RATE: Sed Rate: 30 mm/hr (ref 0–30)

## 2017-02-11 LAB — CBC WITH DIFFERENTIAL/PLATELET
Basophils Absolute: 0.1 10*3/uL (ref 0.0–0.1)
Basophils Relative: 0.5 % (ref 0.0–3.0)
EOS ABS: 0.4 10*3/uL (ref 0.0–0.7)
EOS PCT: 2.8 % (ref 0.0–5.0)
HCT: 37.1 % (ref 36.0–46.0)
Hemoglobin: 12 g/dL (ref 12.0–15.0)
LYMPHS ABS: 2.3 10*3/uL (ref 0.7–4.0)
Lymphocytes Relative: 17.1 % (ref 12.0–46.0)
MCHC: 32.3 g/dL (ref 30.0–36.0)
MCV: 80.4 fl (ref 78.0–100.0)
MONO ABS: 0.7 10*3/uL (ref 0.1–1.0)
Monocytes Relative: 5.3 % (ref 3.0–12.0)
NEUTROS PCT: 74.3 % (ref 43.0–77.0)
Neutro Abs: 9.9 10*3/uL — ABNORMAL HIGH (ref 1.4–7.7)
Platelets: 366 10*3/uL (ref 150.0–400.0)
RBC: 4.62 Mil/uL (ref 3.87–5.11)
RDW: 15 % (ref 11.5–15.5)
WBC: 13.4 10*3/uL — AB (ref 4.0–10.5)

## 2017-02-11 MED ORDER — CEFDINIR 300 MG PO CAPS: 300.0000 mg | ORAL_CAPSULE | Freq: Two times a day (BID) | ORAL | 0 refills | Status: DC

## 2017-02-11 MED ORDER — PREDNISONE 10 MG PO TABS: ORAL_TABLET | ORAL | 0 refills | Status: DC

## 2017-02-11 MED ORDER — BUDESONIDE-FORMOTEROL FUMARATE 80-4.5 MCG/ACT IN AERO: 2.0000 | INHALATION_SPRAY | Freq: Two times a day (BID) | RESPIRATORY_TRACT | 0 refills | Status: DC

## 2017-02-11 MED ORDER — FAMOTIDINE 20 MG PO TABS: ORAL_TABLET | ORAL | 0 refills | Status: DC

## 2017-02-11 NOTE — Progress Notes (Signed)
Subjective:   Patient ID: Shelley Reese, female    DOB: 12/04/1966,    MRN: [id number]    Brief patient profile:   50  yowf TA/ kindergarden light smoker quit  2004   [place] no allergies or asthma occ bad cough about half the time needed to see doctor to get over it, referred by Dr [name] for eval of dust exp after 9/11 attacks in 2001 with - PFT's 11/26/11 WNL x dlco 69 % corrects to 123   History of Present Illness  11/26/2011  1st pulmonary evaluation for recurrent wheezing with colds  Onset in the 1990's rx with prednisone for maybe 10days,  No maint rx maybe a couple times a year plus abx and cough meds mostly in winter then moved to [place] in 2005 same problem s prednisone because wasn't wheezing but present exacerbation of cough started early November 2012 rx with advair / albuterol  p  "caught daughter's cold" zpak and levaquin never prednsione and never better.   Cough is daily,  dry,  More when  supine but also triggered   at [employer].  Started Zegrid otc no better.  Assoc with sense of nasal congestion but not itching or sneezing. Assoc with sence of throat fullness/ mild dysphagia. Not sob.  rec The key to effective treatment for your cough is eliminating the non-stop cycle of cough you're stuck in long enough to let your airway heal completely and then see if there is anything still making you cough once you stop the cough suppression, but this should take no more than 5 days to figuure out First take delsym two tsp every 12 hours and supplement if needed with  tramadol 50 mg up to 2 every 4 hours to suppress the urge to cough..   Try Nexium 40  Take 30-60 min before first meal of the day and Zergrid 20 mg one bedtime until cough is completely gone for at least a week without the need for cough suppression Prednisone 10 mg take  4 each am x 2 days,   2 each am x 2 days,  1 each am x2days and stop       12/10/2011 f/u ov/Shelley Reese cc cough much better, no use of  suppression, still cough on exp to heavy fumes but this was true before Nov  2012 so feels completely back to baseline now with no need for cough suppression, no sob rec Consider stopping your hormone replacement for a month since this can contribute to GERD Definitely continue nexium 40 mg Take 30-60 min before first meal of the day and Zegerid at bedtime for at least 3 months then defer to Dr [name] regarding longterm medical therapy or referral to a GI doctor  If the cough continues to be a problem next step asthma challenge ideally should not be done while on hormone replacement therapy because it causes false positives (as does reflux) but if you want to have this done call Almyra FreeLibby and schedule it at [phone]> never done .        10/06/2016   [facility] office visit/ re-establish / Cozette Braggs  Re recurrent cough  Chief Complaint  Patient presents with  . Pulmonary Consult    Referred by Shelley LucksJennifer Brown, PA. Pt c/o cough off and on for the past year. She states she has had PNA several times in 2017. She states her cough is occ prod with yellow to green sputum. She coughs until she vomits.  last abx x one month prior to OV then sick again with severe cough / green mucus and cough to vomiting  Sob mostly when coughing/ her blue inhaler helps some (dulera) rec omnicef 300 mg one twice daily x 10 days  Best cough med  > mucined dm up to 1200 mg every 12 hours as needed For wheeze > dulera up to 2 puff every 12 hours as needed GERD  Diet After the first of the year you will need a sinus ct and high resolution ct chest> delayed due to insurance      02/11/2017  f/u ov/Shelley Reese re:  Cough/ sob  Transiently better p last ov / only rx = dexilant  Chief Complaint  Patient presents with  . Follow-up    Recently had a CT test done. Has bronchitis for the past 2 months.    around 1st Dec 2018  L neck pain dx as dvt  Breathing gradually worse with cough most productive at hs and in am since off dulera   Cough worse in am and again productive of green mucus   No obvious day to day or daytime variability or assoc   mucus plugs or hemoptysis or cp or chest tightness, subjective wheeze or overt sinus or hb symptoms. No unusual exp hx or h/o childhood pna/ asthma or knowledge of premature birth.  Sleeping ok without nocturnal  or early am exacerbation  of respiratory  c/o's or need for noct saba. Also denies any obvious fluctuation of symptoms with weather or environmental changes or other aggravating or alleviating factors except as outlined above   Current Medications, Allergies, Complete Past Medical History, Past Surgical History, Family History, and Social History were reviewed in [employer] record.  ROS  The following are not active complaints unless bolded sore throat, dysphagia, dental problems, itching, sneezing,  nasal congestion or excess/ purulent secretions, ear ache,   fever, chills, sweats, unintended wt loss, classically pleuritic or exertional cp,  orthopnea pnd or leg swelling, presyncope, palpitations, abdominal pain, anorexia, nausea, vomiting, diarrhea  or change in bowel or bladder habits, change in stools or urine, dysuria,hematuria,  rash, arthralgias, visual complaints, headache, numbness, weakness or ataxia or problems with walking or coordination,  change in mood/affect or memory.                Objective:   Physical Exam   02/11/2017         218   10/06/16 220 lb (99.8 kg)  12/19/12 236 lb (107 kg)  12/10/11 229 lb (103.9 kg)    Vital signs reviewed - Note on arrival 02 sats  96% on RA    HEENT: nl dentition, turbinates, and oropharynx. Nl external ear canals without cough reflex   NECK :  without JVD/Nodes/TM/ nl carotid upstrokes bilaterally   LUNGS: no acc muscle use,  Nl contour chest with insp and exp  Minimal  rhonchi bilaterally   CV:  RRR  no s3 or murmur or increase in P2, no edema   ABD:  soft and nontender with nl  inspiratory excursion in the supine position. No bruits or organomegaly, bowel sounds nl  MS:  Nl gait/ ext warm without deformities, calf tenderness, cyanosis or clubbing No obvious joint restrictions   SKIN: warm and dry without lesions    NEURO:  alert, approp, nl sensorium with  no motor deficits       I personally reviewed images and agree with radiology impression as follows:  CT with contrast Chest  01/28/17 No evidence of central venous obstruction or mediastinal mass.  Diffuse dilatation of thoracic esophagus, suspicious for achalasia. No definite obstructing mass visualized by CT. Recommend endoscopy for further evaluation.  Diffuse heterogeneous ground-glass pulmonary opacity. Differential diagnosis includes aspiration or inhalational pneumonitis, atypical infection, or mild edema    Lab Results  Component Value Date   WBC 13.4 (H) 02/11/2017   HGB 12.0 02/11/2017   HCT 37.1 02/11/2017   MCV 80.4 02/11/2017   PLT 366.0 02/11/2017        EOS                      0.4                                                                     02/11/2017     Lab Results  Component Value Date   ESRSEDRATE 30 02/11/2017        Assessment & Plan:

## 2017-02-11 NOTE — Patient Instructions (Addendum)
Omnicef 300 mg twice daily x 10 days then sinus CT after finish but before return   symbicort 80 Take 2 puffs first thing in am and then another 2 puffs about 12 hours later.   Prednisone 10 mg take  4 each am x 2 days,   2 each am x 2 days,  1 each am x 2 days and stop   Dexilant 60 mg   Take  30-60 min before first meal of the day and Pepcid (famotidine)  20 mg one @  bedtime until return to office - this is the best way to tell whether stomach acid is contributing to your problem.    Please remember to go to the lab   department downstairs in the basement  for your tests - we will call you with the results when they are available.     Please schedule a follow up office visit in 2 weeks, sooner if needed

## 2017-02-13 DIAGNOSIS — R918 Other nonspecific abnormal finding of lung field: Secondary | ICD-10-CM | POA: Insufficient documentation

## 2017-02-13 NOTE — Assessment & Plan Note (Signed)
See CT chest 01/28/17  - ESR  30 02/11/2017  Miscellaneous:Alv microlithiasis, alv proteinosis, asp, bronchiectais, BOOP   ARDS/ AIP Occupational dz/ HSP Neoplasm Infection Drug  Pulmonary emboli, Protein disorders Edema/Eosinophilic dz Sarcoidosis Connective tissue dz Hist X / Hemorrhage Idiopathic   At this point not clear and will need repeat HRCT and pfts to start the w/u over  > for short term since no evidence infection rx with only 6 day pred and f/u in 2 weeks   I had an extended discussion with the patient reviewing all relevant studies completed to date and  lasting 15 to 20 minutes of a 25 minute visit    Each maintenance medication was reviewed in detail including most importantly the difference between maintenance and prns and under what circumstances the prns are to be triggered using an action plan format that is not reflected in the computer generated alphabetically organized AVS.    Please see AVS for specific instructions unique to this visit that I personally wrote and verbalized to the the pt in detail and then reviewed with pt  by my nurse highlighting any  changes in therapy recommended at today's visit to their plan of care.

## 2017-02-13 NOTE — Assessment & Plan Note (Addendum)
-   Exposed to 08/16/00 dust but no chronic symptoms - PFT's 11/26/11 WNL x dlco 69 % corrects to 123 - 02/11/2017  After extensive coaching HFA effectiveness =    75% > try symbicort 80 2bid  - Sinus CT ordered for after 10 days of omnicef started on 02/11/2017   Many of her symptoms are atypical and may relate more to the upper airway than the lower airways or the findings on CT chest , esp in pt with dysphagia/ achalasia   rec max rx for gerd/ empirical abx for sinusitis and restart low dose ics until can sort out source of her symptoms

## 2017-02-14 NOTE — Progress Notes (Signed)
LMTCB

## 2017-02-15 NOTE — Progress Notes (Signed)
LMTCB

## 2017-02-15 NOTE — Progress Notes (Signed)
Spoke with pt and notified of results per Dr. Wert. Pt verbalized understanding and denied any questions. 

## 2017-02-23 ENCOUNTER — Ambulatory Visit (INDEPENDENT_AMBULATORY_CARE_PROVIDER_SITE_OTHER)
Admission: RE | Admit: 2017-02-23 | Discharge: 2017-02-23 | Disposition: A | Discharge status: Home / Self Care | Payer: BC Managed Care – PPO | Source: Ambulatory Visit | Attending: Internal Medicine | Admitting: Internal Medicine

## 2017-02-23 DIAGNOSIS — R05 Cough: Secondary | ICD-10-CM | POA: Diagnosis not present

## 2017-02-23 DIAGNOSIS — R059 Cough, unspecified: Secondary | ICD-10-CM

## 2017-02-24 NOTE — Progress Notes (Signed)
Spoke with pt and notified of results per Dr. Wert. Pt verbalized understanding and denied any questions. 

## 2017-02-24 NOTE — Progress Notes (Signed)
LMTCB

## 2017-02-28 ENCOUNTER — Other Ambulatory Visit: Payer: Self-pay | Admitting: Gastroenterology

## 2017-02-28 DIAGNOSIS — K220 Achalasia of cardia: Secondary | ICD-10-CM

## 2017-03-02 ENCOUNTER — Encounter: Payer: Self-pay | Admitting: Internal Medicine

## 2017-03-02 ENCOUNTER — Ambulatory Visit (INDEPENDENT_AMBULATORY_CARE_PROVIDER_SITE_OTHER): Payer: BC Managed Care – PPO | Admitting: Internal Medicine

## 2017-03-02 VITALS — BP 122/90 | HR 74 | Ht 69.5 in | Wt 217.0 lb

## 2017-03-02 DIAGNOSIS — R918 Other nonspecific abnormal finding of lung field: Secondary | ICD-10-CM

## 2017-03-02 DIAGNOSIS — J45991 Cough variant asthma: Secondary | ICD-10-CM | POA: Diagnosis not present

## 2017-03-02 MED ORDER — BUDESONIDE-FORMOTEROL FUMARATE 80-4.5 MCG/ACT IN AERO: 2.0000 | INHALATION_SPRAY | Freq: Two times a day (BID) | RESPIRATORY_TRACT | 0 refills | Status: DC

## 2017-03-02 MED ORDER — BUDESONIDE-FORMOTEROL FUMARATE 80-4.5 MCG/ACT IN AERO: 2.0000 | INHALATION_SPRAY | Freq: Two times a day (BID) | RESPIRATORY_TRACT | 5 refills | Status: DC

## 2017-03-02 NOTE — Progress Notes (Signed)
Subjective:   Patient ID: Shelley Reese, female    DOB: 12/04/1966,    MRN: [id number]    Brief patient profile:   50  yowf TA/ kindergarden light smoker quit  2004   [place] no allergies or asthma occ bad cough about half the time needed to see doctor to get over it, referred by Dr Shelley Reese for eval of dust exp after 9/11 attacks in 2001 with - PFT's 11/26/11 WNL x dlco 69 % corrects to 123   History of Present Illness  11/26/2011  1st pulmonary evaluation for recurrent wheezing with colds  Onset in the 1990's rx with prednisone for maybe 10days,  No maint rx maybe a couple times a year plus abx and cough meds mostly in winter then moved to [place] in 2005 same problem s prednisone because wasn't wheezing but present exacerbation of cough started early November 2012 rx with advair / albuterol  p  "caught daughter's cold" zpak and levaquin never prednsione and never better.   Cough is daily,  dry,  More when  supine but also triggered   at [employer].  Started Zegrid otc no better.  Assoc with sense of nasal congestion but not itching or sneezing. Assoc with sence of throat fullness/ mild dysphagia. Not sob.  rec The key to effective treatment for your cough is eliminating the non-stop cycle of cough you're stuck in long enough to let your airway heal completely and then see if there is anything still making you cough once you stop the cough suppression, but this should take no more than 5 days to figuure out First take delsym two tsp every 12 hours and supplement if needed with  tramadol 50 mg up to 2 every 4 hours to suppress the urge to cough..   Try Nexium 40  Take 30-60 min before first meal of the day and Zergrid 20 mg one bedtime until cough is completely gone for at least a week without the need for cough suppression Prednisone 10 mg take  4 each am x 2 days,   2 each am x 2 days,  1 each am x2days and stop       12/10/2011 f/u ov/Shelley Reese cc cough much better, no use of  suppression, still cough on exp to heavy fumes but this was true before Nov  2012 so feels completely back to baseline now with no need for cough suppression, no sob rec Consider stopping your hormone replacement for a month since this can contribute to GERD Definitely continue nexium 40 mg Take 30-60 min before first meal of the day and Zegerid at bedtime for at least 3 months then defer to Dr Shelley Reese regarding longterm medical therapy or referral to a GI doctor  If the cough continues to be a problem next step asthma challenge ideally should not be done while on hormone replacement therapy because it causes false positives (as does reflux) but if you want to have this done call [facility] and schedule it at [phone]> never done .        10/06/2016   [facility] office visit/ re-establish / Shelley Reese  Re recurrent cough  Chief Complaint  Patient presents with  . Pulmonary Consult    Referred by Shelley LucksJennifer Brown, PA. Pt c/o cough off and on for the past year. She states she has had PNA several times in 2017. She states her cough is occ prod with yellow to green sputum. She coughs until she vomits.  last abx x one month prior to OV then sick again with severe cough / green mucus and cough to vomiting  Sob mostly when coughing/ her blue inhaler helps some (dulera) rec omnicef 300 mg one twice daily x 10 days  Best cough med  > mucined dm up to 1200 mg every 12 hours as needed For wheeze > dulera up to 2 puff every 12 hours as needed GERD  Diet After the first of the year you will need a sinus ct and high resolution ct chest> delayed due to insurance      02/11/2017  f/u ov/Shelley Reese re:  Cough/ sob  Transiently better p last ov / only rx = dexilant  Chief Complaint  Patient presents with  . Follow-up    Recently had a CT test done. Has bronchitis for the past 2 months.    around 1st Dec 2018  L neck pain dx as dvt  Breathing gradually worse with cough most productive at hs and in am since off dulera   Cough worse in am and again productive of green mucus  rec Omnicef 300 mg twice daily x 10 days then sinus CT after finish but before return  Symbicort 80 Take 2 puffs first thing in am and then another 2 puffs about 12 hours later.  Prednisone 10 mg take  4 each am x 2 days,   2 each am x 2 days,  1 each am x 2 days and stop  Dexilant 60 mg   Take  30-60 min before first meal of the day and Pepcid (famotidine)  20 mg one @  bedtime until return to office - this is the best way to tell whether stomach acid is contributing to your problem.        03/02/2017  f/u ov/Shelley Reese re: chronic cough/ infiltrates recent dx achalasia  Chief Complaint  Patient presents with  . Follow-up    Pt feels like her breathing has been better and the cough is better     all smiles/ Not limited by breathing from desired activities on symb 80 2bid      No obvious day to day or daytime variability or assoc excess/ purulent sputum or mucus plugs or hemoptysis or cp or chest tightness, subjective wheeze or overt sinus or hb symptoms. No unusual exp hx or h/o childhood pna/ asthma or knowledge of premature birth.  Sleeping ok without nocturnal  or early am exacerbation  of respiratory  c/o's or need for noct saba. Also denies any obvious fluctuation of symptoms with weather or environmental changes or other aggravating or alleviating factors except as outlined above   Current Medications, Allergies, Complete Past Medical History, Past Surgical History, Family History, and Social History were reviewed in [employer] record.  ROS  The following are not active complaints unless bolded sore throat, dysphagia, dental problems, itching, sneezing,  nasal congestion or excess/ purulent secretions, ear ache,   fever, chills, sweats, unintended wt loss, classically pleuritic or exertional cp,  orthopnea pnd or leg swelling, presyncope, palpitations, abdominal pain, anorexia, nausea, vomiting, diarrhea  or  change in bowel or bladder habits, change in stools or urine, dysuria,hematuria,  rash, arthralgias, visual complaints, headache, numbness, weakness or ataxia or problems with walking or coordination,  change in mood/affect or memory.              Objective:   Physical Exam  03/02/2017         217  02/11/2017  218   10/06/16 220 lb (99.8 kg)  12/19/12 236 lb (107 kg)  12/10/11 229 lb (103.9 kg)    Vital signs reviewed - Note on arrival 02 sats  96% on RA    HEENT: nl dentition, turbinates, and oropharynx. Nl external ear canals without cough reflex   NECK :  without JVD/Nodes/TM/ nl carotid upstrokes bilaterally   LUNGS: no acc muscle use,  Nl contour chest with clear bs bilaterally s cough on insp or exp   CV:  RRR  no s3 or murmur or increase in P2, no edema   ABD:  soft and nontender with nl inspiratory excursion in the supine position. No bruits or organomegaly, bowel sounds nl  MS:  Nl gait/ ext warm without deformities, calf tenderness, cyanosis or clubbing No obvious joint restrictions   SKIN: warm and dry without lesions    NEURO:  alert, approp, nl sensorium with  no motor deficits       I personally reviewed images and agree with radiology impression as follows:  CT2/23/18  Chest  w contrast  No evidence of central venous obstruction or mediastinal mass.  Diffuse dilatation of thoracic esophagus, suspicious for achalasia. No definite obstructing mass visualized by CT. Recommend endoscopy for further evaluation.  Diffuse heterogeneous ground-glass pulmonary opacity. Differential diagnosis includes aspiration or inhalational pneumonitis, atypical infection, or mild edema.           Assessment & Plan:   Outpatient Encounter Prescriptions as of 03/02/2017  Medication Sig  . dexlansoprazole (DEXILANT) 60 MG capsule Take 60 mg by mouth daily.  . famotidine (PEPCID) 20 MG tablet One at bedtime  . PARoxetine (PAXIL) 10 MG tablet Take 10 mg by mouth  daily.  . Rivaroxaban (XARELTO PO) Take by mouth.  . [DISCONTINUED] budesonide-formoterol (SYMBICORT) 80-4.5 MCG/ACT inhaler Inhale 2 puffs into the lungs 2 (two) times daily.  . budesonide-formoterol (SYMBICORT) 80-4.5 MCG/ACT inhaler Inhale 2 puffs into the lungs 2 (two) times daily.  . [DISCONTINUED] budesonide-formoterol (SYMBICORT) 80-4.5 MCG/ACT inhaler Inhale 2 puffs into the lungs 2 (two) times daily.  . [DISCONTINUED] cefdinir (OMNICEF) 300 MG capsule Take 1 capsule (300 mg total) by mouth 2 (two) times daily. (Patient not taking: Reported on 03/02/2017)  . [DISCONTINUED] predniSONE (DELTASONE) 10 MG tablet Take  4 each am x 2 days,   2 each am x 2 days,  1 each am x 2 days and stop (Patient not taking: Reported on 03/02/2017)   No facility-administered encounter medications on file as of 03/02/2017.

## 2017-03-02 NOTE — Patient Instructions (Addendum)
Continue symbicort 80 Take 2 puffs first thing in am and then another 2 puffs about 12 hours later.   Work on Chemical engineerperfecting inhaler technique:  relax and gently blow all the way out then take a nice smooth deep breath back in, triggering the inhaler at same time you start breathing in.  Hold for up to 5 seconds if you can. Blow out thru nose. Rinse and gargle with water when done      Please schedule a follow up visit in 3 months but call sooner if needed

## 2017-03-06 NOTE — Assessment & Plan Note (Signed)
See CT chest 01/28/17  c/ asp from achalasia  - ESR  30 02/11/2017  = 30 rx pred x 6 d, omnicef x 10 and gerd rx > improved 03/02/2017   Already improved on conservative rx > resolution of dysphagia key> no furthe abx for now

## 2017-03-06 NOTE — Assessment & Plan Note (Addendum)
-   Exposed to 08/16/00 dust but no chronic symptoms - PFT's 11/26/11 WNL x dlco 69 % corrects to 123 - 02/11/2017   > try symbicort 80 2bid  - Sinus CT 02/23/2017 >Mucosal thickening involving the paranasal sinuses, without acute fluid level   - 03/02/2017  After extensive coaching HFA effectiveness =  75% > continue symb 80 2bid   Each maintenance medication was reviewed in detail including most importantly the difference between maintenance and as needed and under what circumstances the prns are to be used.  Please see AVS for specific  Instructions which are unique to this visit and I personally typed out  which were reviewed in detail in writing with the patient and a copy provided.

## 2017-03-07 ENCOUNTER — Other Ambulatory Visit: Payer: BC Managed Care – PPO

## 2017-03-09 ENCOUNTER — Ambulatory Visit (HOSPITAL_COMMUNITY)
Admission: RE | Admit: 2017-03-09 | Discharge: 2017-03-09 | Disposition: A | Discharge status: Home / Self Care | Payer: BC Managed Care – PPO | Source: Ambulatory Visit | Attending: Gastroenterology | Admitting: Gastroenterology

## 2017-03-09 ENCOUNTER — Ambulatory Visit (INDEPENDENT_AMBULATORY_CARE_PROVIDER_SITE_OTHER): Payer: BC Managed Care – PPO | Admitting: Pulmonary Disease

## 2017-03-09 ENCOUNTER — Encounter: Payer: Self-pay | Admitting: Pulmonary Disease

## 2017-03-09 ENCOUNTER — Encounter (HOSPITAL_COMMUNITY)
Admission: RE | Disposition: A | Discharge status: Home / Self Care | Payer: Self-pay | Source: Ambulatory Visit | Attending: Gastroenterology

## 2017-03-09 DIAGNOSIS — R059 Cough, unspecified: Secondary | ICD-10-CM | POA: Insufficient documentation

## 2017-03-09 DIAGNOSIS — R05 Cough: Secondary | ICD-10-CM

## 2017-03-09 HISTORY — PX: ESOPHAGEAL MANOMETRY: SHX5429

## 2017-03-09 SURGERY — MANOMETRY, ESOPHAGUS

## 2017-03-09 MED ORDER — PREDNISONE 10 MG PO TABS: ORAL_TABLET | ORAL | 0 refills | Status: DC

## 2017-03-09 MED ORDER — LIDOCAINE VISCOUS 2 % MT SOLN
OROMUCOSAL | Status: AC
  Filled 2017-03-09: qty 15

## 2017-03-09 MED ORDER — DOXYCYCLINE HYCLATE 100 MG PO TABS: 100.0000 mg | ORAL_TABLET | Freq: Two times a day (BID) | ORAL | 0 refills | Status: DC

## 2017-03-09 SURGICAL SUPPLY — 2 items
FACESHIELD LNG OPTICON STERILE (SAFETY) IMPLANT
GLOVE BIO SURGEON STRL SZ8 (GLOVE) ×4 IMPLANT

## 2017-03-09 NOTE — Patient Instructions (Signed)
It was a pleasure taking care of you today!  You are diagnosed with bronchitis. Start Prednisone 10 mg/tab, 2 tabs/day for 1 week followed by 1 tab/day for 1 week Make sure you take your antibiotics:  Doxycycline 100 mg/tab, 1 tab 2x/day for 1 week  We discussed potential side effects/adverse reactions of antibiotics, including, but not limited to: rash, diarrhea.  Please call the office if you are having adverse reaction to meds/antibiotics.  Please call the office your symptoms are getting worse despite the meds/antibiotics.    Return to clinic in 2-3 weeks with Dr. Sherene Sires or NP.

## 2017-03-09 NOTE — Progress Notes (Signed)
Subjective:    Patient ID: Shelley Reese, female    DOB: [birth year], 51 y.o.   MRN: [id number]  HPI Patient is being seen by Dr. Sherene Sires for chronic cough. There is a recent diagnosis of achalasia. She had endoscopy as well as manometry and is being seen by GI doctor. She is also currently being seen by a rheumatologist as she is being worked up for underlying autoimmune disease.  The last time she saw Dr. Sherene Sires was a month ago. At that time, her cough was better. She had just finished prednisone. Prednisone helped her. The last week or so, especially after endoscopy, she started coughing again. Still with exertional dyspnea, nonspecific fevers and chills. Has sinus congestion as well. Cough is worse at night.    Her reflux is being managed by her GI doctor. She takes medicines for that.    Review of Systems  Constitutional: Negative.   HENT: Negative.   Eyes: Negative.   Respiratory: Positive for cough and shortness of breath.   Cardiovascular: Negative.   Gastrointestinal: Negative.   Endocrine: Negative.   Genitourinary: Negative.   Musculoskeletal: Negative.   Skin: Negative.   Allergic/Immunologic: Negative.   Neurological: Negative.   Hematological: Negative.   Psychiatric/Behavioral: Negative.   All other systems reviewed and are negative.      Objective:   Physical Exam  Vitals:  Vitals:   03/09/17 1005  BP: 102/62  Pulse: (!) 103  SpO2: 97%  Weight: 204 lb 9.6 oz (92.8 kg)  Height:  (1.753 m)    Constitutional/General:  Pleasant, well-nourished, well-developed, not in any distress,  Comfortably seating.  Well kempt  Body mass index is 30.21 kg/m. Wt Readings from Last 3 Encounters:  03/09/17 204 lb 9.6 oz (92.8 kg)  03/02/17 217 lb (98.4 kg)  02/11/17 218 lb 6.4 oz (99.1 kg)     HEENT: Pupils equal and reactive to light and accommodation. Anicteric sclerae. Normal nasal mucosa.   No oral  lesions,  mouth clear,  oropharynx clear, no postnasal  drip. (-) Oral thrush. No dental caries.  Airway - Mallampati class III  Neck: No masses. Midline trachea. No JVD, (-) LAD. (-) bruits appreciated.  Respiratory/Chest: Grossly normal chest. (-) deformity. (-) Accessory muscle use.  Symmetric expansion. (-) Tenderness on palpation.  Resonant on percussion.  Diminished BS on both lower lung zones. (-) wheezing, crackles, rhonchi (-) egophony  Cardiovascular: Regular rate and  rhythm, heart sounds normal, no murmur or gallops, no peripheral edema  Gastrointestinal:  Normal bowel sounds. Soft, non-tender. No hepatosplenomegaly.  (-) masses.   Musculoskeletal:  Normal muscle tone. Normal gait.   Extremities: Grossly normal. (-) clubbing, cyanosis.  (-) edema  Skin: (-) rash,lesions seen.   Neurological/Psychiatric : alert, oriented to time, place, person. Normal mood and affect           Assessment & Plan:  Cough Patient is being seen by Dr. Sherene Sires for chronic cough which is multifactorial. Based on notes, most likely related to upper airway cough syndrome/reflux disease/possible asthma. She improved with prednisone use as well as reflux management.  Recent flare of cough again. Occasionally productive of clear-green sputum. Reflux is also not optimally managed.  Differentials : 1. Still related to UACS/asthma/GERD 2. Possible allergic bronchitis with the pollen/change in weather. R/O bacterial PNA 3. Could be part of an undiagnosed CTD. It seems she has a constellation of symptoms with cough, dyspnea, reflux disease, abnormal chest CT scan, achalasia.  Plan : 1.  Prednisone 20 mg x 1 week then 10 mg x 1 week 2. Doxycycline 100 mg BID x 1 week 3.  GERD meds per GI. On PPI in am. Consider making to BID. Also on zantac.  4. F/U with GI and Rheum. As mentioned, she is being worked up for possible connective tissue disease.  Return to clinic in 2-3 weeks with NP or Dr. Nehemiah Massed. Alexis Frock, MD 03/09/2017, 12:59  PM [facility] Pager [phone] After 3 pm or if no answer, call [phone]

## 2017-03-09 NOTE — Progress Notes (Signed)
Called pt regarding manometry scheduled for this am. Pt has not shown up. No answer. Left message for patient to reschedule.

## 2017-03-09 NOTE — Progress Notes (Signed)
Pt showed up at 845 for manometry appointment. Esophageal manometry done per protocol. Pt tolerated fair. Pt vomited about 150cc during insertion. No problems breathing or blood noted on probe at all. Pt tolerated study well. Report to be given to Dr. Loreta Ave.

## 2017-03-09 NOTE — Assessment & Plan Note (Signed)
Patient is being seen by Dr. Sherene Sires for chronic cough which is multifactorial. Based on notes, most likely related to upper airway cough syndrome/reflux disease/possible asthma. She improved with prednisone use as well as reflux management.  Recent flare of cough again. Occasionally productive of clear-green sputum. Reflux is also not optimally managed.  Differentials : 1. Still related to UACS/asthma/GERD 2. Possible allergic bronchitis with the pollen/change in weather. R/O bacterial PNA 3. Could be part of an undiagnosed CTD. It seems she has a constellation of symptoms with cough, dyspnea, reflux disease, abnormal chest CT scan, achalasia.  Plan : 1. Prednisone 20 mg x 1 week then 10 mg x 1 week 2. Doxycycline 100 mg BID x 1 week 3.  GERD meds per GI. On PPI in am. Consider making to BID. Also on zantac.  4. F/U with GI and Rheum. As mentioned, she is being worked up for possible connective tissue disease.

## 2017-03-11 ENCOUNTER — Ambulatory Visit
Admission: RE | Admit: 2017-03-11 | Discharge: 2017-03-11 | Disposition: A | Discharge status: Home / Self Care | Payer: BC Managed Care – PPO | Source: Ambulatory Visit | Attending: Gastroenterology | Admitting: Gastroenterology

## 2017-03-11 DIAGNOSIS — K220 Achalasia of cardia: Secondary | ICD-10-CM

## 2017-03-22 ENCOUNTER — Ambulatory Visit (INDEPENDENT_AMBULATORY_CARE_PROVIDER_SITE_OTHER): Payer: BC Managed Care – PPO | Admitting: Internal Medicine

## 2017-03-22 ENCOUNTER — Encounter: Payer: Self-pay | Admitting: Internal Medicine

## 2017-03-22 ENCOUNTER — Ambulatory Visit (INDEPENDENT_AMBULATORY_CARE_PROVIDER_SITE_OTHER)
Admission: RE | Admit: 2017-03-22 | Discharge: 2017-03-22 | Disposition: A | Discharge status: Home / Self Care | Payer: BC Managed Care – PPO | Source: Ambulatory Visit | Attending: Internal Medicine | Admitting: Internal Medicine

## 2017-03-22 VITALS — BP 128/70 | HR 77 | Ht 69.5 in | Wt 214.8 lb

## 2017-03-22 DIAGNOSIS — J45991 Cough variant asthma: Secondary | ICD-10-CM | POA: Diagnosis not present

## 2017-03-22 DIAGNOSIS — R918 Other nonspecific abnormal finding of lung field: Secondary | ICD-10-CM

## 2017-03-22 MED ORDER — FLUTTER DEVI: 0 refills | Status: DC

## 2017-03-22 NOTE — Patient Instructions (Signed)
For cough > mucinex dm up to 1200 mg every 12 hours and add the flutter valve as much as possible  Work on inhaler technique:  relax and gently blow all the way out then take a nice smooth deep breath back in, triggering the inhaler at same time you start breathing in.  Hold for up to 5 seconds if you can. Blow out thru nose. Rinse and gargle with water when done     Please remember to go to the  x-ray department downstairs in the basement  for your tests - we will call you with the results when they are available.   Please schedule a follow up visit in 3 months with pfts

## 2017-03-22 NOTE — Progress Notes (Signed)
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   grew up in [place] no allergies or asthma occ bad cough about half the time needed to see doctor to get over it, referred by Dr Renae Gloss for eval of dust exp after 9/11 attacks in 2001 with - PFT's 11/26/11 WNL x dlco 69 % corrects to 123   History of Present Illness  11/26/2011  1st pulmonary evaluation for recurrent wheezing with colds  Onset in the 1990's rx with prednisone for maybe 10days,  No maint rx maybe a couple times a year plus abx and cough meds mostly in winter then moved to [place] in 2005 same problem s prednisone because wasn't wheezing but present exacerbation of cough started early November 2012 rx with advair / albuterol  p  "caught daughter's cold" zpak and levaquin never prednsione and never better.   Cough is daily,  dry,  More when  supine but also triggered   at [employer].  Started Zegrid otc no better.  Assoc with sense of nasal congestion but not itching or sneezing. Assoc with sence of throat fullness/ mild dysphagia. Not sob.  rec The key to effective treatment for your cough is eliminating the non-stop cycle of cough you're stuck in long enough to let your airway heal completely and then see if there is anything still making you cough once you stop the cough suppression, but this should take no more than 5 days to figuure out First take delsym two tsp every 12 hours and supplement if needed with  tramadol 50 mg up to 2 every 4 hours to suppress the urge to cough..   Try Nexium 40  Take 30-60 min before first meal of the day and Zergrid 20 mg one bedtime until cough is completely gone for at least a week without the need for cough suppression Prednisone 10 mg take  4 each am x 2 days,   2 each am x 2 days,  1 each am x2days and stop       12/10/2011 f/u ov/Shelley Reese cc cough much better, no use of  suppression, still cough on exp to heavy fumes but this was true before Nov  2012 so feels completely back to baseline now with no need for cough suppression, no sob rec Consider stopping your hormone replacement for a month since this can contribute to GERD Definitely continue nexium 40 mg Take 30-60 min before first meal of the day and Zegerid at bedtime for at least 3 months then defer to Dr Renae Gloss regarding longterm medical therapy or referral to a GI doctor  If the cough continues to be a problem next step asthma challenge ideally should not be done while on hormone replacement therapy because it causes false positives (as does reflux) but if you want to have this done call Shelley Reese and schedule it at [phone]> never done .        10/06/2016   [facility] visit/ re-establish / Shelley Reese  Re recurrent cough  Chief Complaint  Patient presents with  . Pulmonary Consult    Referred by Boneta Lucks, PA. Pt c/o cough off and on for the past year. She states she has had PNA several times in 2017. She states her cough is occ prod with yellow to green sputum. She coughs until she vomits.  last abx x one month prior to OV then sick again with severe cough / green mucus and cough to vomiting  Sob mostly when coughing/ her blue inhaler helps some (dulera) rec omnicef 300 mg one twice daily x 10 days  Best cough med  > mucined dm up to 1200 mg every 12 hours as needed For wheeze > dulera up to 2 puff every 12 hours as needed GERD  Diet After the first of the year you will need a sinus ct and high resolution ct chest> delayed due to insurance      02/11/2017  f/u ov/Shelley Reese re:  Cough/ sob  Transiently better p last ov / only rx = dexilant  Chief Complaint  Patient presents with  . Follow-up    Recently had a CT test done. Has bronchitis for the past 2 months.    around 1st Dec 2018  L neck pain dx as dvt  Breathing gradually worse with cough most productive at hs and in am since off dulera   Cough worse in am and again productive of green mucus  rec Omnicef 300 mg twice daily x 10 days then sinus CT after finish but before return  Symbicort 80 Take 2 puffs first thing in am and then another 2 puffs about 12 hours later.  Prednisone 10 mg take  4 each am x 2 days,   2 each am x 2 days,  1 each am x 2 days and stop  Dexilant 60 mg   Take  30-60 min before first meal of the day and Pepcid (famotidine)  20 mg one @  bedtime until return to office - this is the best way to tell whether stomach acid is contributing to your problem.        03/02/2017  f/u ov/Shelley Reese re: chronic cough/ infiltrates recent dx achalasia  Chief Complaint  Patient presents with  . Follow-up    Pt feels like her breathing has been better and the cough is better   all smiles/ Not limited by breathing from desired activities on symb 80 2bid    rec Continue symbicort 80 Take 2 puffs first thing in am and then another 2 puffs about 12 hours later.  Work on Chemical engineer technique:  relax and gently blow all the way out then take a nice smooth deep breath back in, triggering the inhaler at same time you start breathing in.  Hold for up to 5 seconds if you can. Blow out thru nose. 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 Follow-up     3 week follow up, pt feels ok but she has been diagnosed with alcolasia and is wondering what she can do since her surgery is not until june   harsh barking/ honking cough / minimally productive and more day than noct  No obvious other  day to day or daytime variability or assoc excess/ purulent sputum or mucus plugs or hemoptysis or cp or chest tightness, subjective wheeze or overt sinus or hb symptoms. No unusual exp hx or h/o childhood pna/ asthma or knowledge of premature birth.  Sleeping ok without nocturnal  or early am  exacerbation  of respiratory  c/o's or need for noct saba. Also denies any obvious fluctuation of symptoms with weather or environmental changes or other  aggravating or alleviating factors except as outlined above   Current Medications, Allergies, Complete Past Medical History, Past Surgical History, Family History, and Social History were reviewed in [employer] record.  ROS  The following are not active complaints unless bolded sore throat, dysphagia, dental problems, itching, sneezing,  nasal congestion or excess/ purulent secretions, ear ache,   fever, chills, sweats, unintended wt loss, classically pleuritic or exertional cp,  orthopnea pnd or leg swelling, presyncope, palpitations, abdominal pain, anorexia, nausea, vomiting, diarrhea  or change in bowel or bladder habits, change in stools or urine, dysuria,hematuria,  rash, arthralgias, visual complaints, headache, numbness, weakness or ataxia or problems with walking or coordination,  change in mood/affect or memory.                         Objective:   Physical Exam  Pleasantly anxious amb wf nad with honking cough  03/22/2017        215  03/02/2017         217  02/11/2017         218   10/06/16 220 lb (99.8 kg)  12/19/12 236 lb (107 kg)  12/10/11 229 lb (103.9 kg)    Vital signs reviewed - Note on arrival 02 sats  98% on RA    HEENT: nl dentition, turbinates, and oropharynx. Nl external ear canals without cough reflex   NECK :  without JVD/Nodes/TM/ nl carotid upstrokes bilaterally   LUNGS: no acc muscle use,  Nl contour chest with coarse insp and exp  rhonchi bilaterally   CV:  RRR  no s3 or murmur or increase in P2, no edema   ABD:  soft and nontender with nl inspiratory excursion in the supine position. No bruits or organomegaly, bowel sounds nl  MS:  Nl gait/ ext warm without deformities, calf tenderness, cyanosis or clubbing No obvious joint restrictions   SKIN: warm and dry without  lesions    NEURO:  alert, approp, nl sensorium with  no motor deficits      CXR PA and Lateral:   03/22/2017 :    I personally reviewed images and agree with radiology impression as follows:   The lungs are adequately inflated. The interstitial markings are coarse though stable. There is no alveolar infiltrate or pleural effusion. The heart and pulmonary vascularity are normal. The mediastinum is normal in width. The trachea is midline. The bony thorax exhibits no acute abnormality.    DgEs 03/11/17  Achalasia. Marked esophageal dilatation with poor esophageal motility.        Assessment & Plan:

## 2017-03-23 ENCOUNTER — Encounter: Payer: Self-pay | Admitting: Internal Medicine

## 2017-03-23 NOTE — Progress Notes (Signed)
Called pt and left detailed msg with results

## 2017-03-23 NOTE — Assessment & Plan Note (Signed)
See CT chest 01/28/17  c/ asp from achalasia  - ESR  30 02/11/2017  = 30  rx pred x 6 d, omnicef x 10 and gerd rx > improved 03/02/2017    Advised that this problem is not likely to stabilize until the underlying issue is resolved, namely addressing the achalasia which I understand is a plan to do surgically per Dr. Rosenbower. 

## 2017-03-23 NOTE — Assessment & Plan Note (Signed)
-   Exposed to 08/16/00 dust but no chronic symptoms - PFT's 11/26/11 WNL x dlco 69 % corrects to 123 - 02/11/2017   > try symbicort 80 2bid  - Sinus CT 02/23/2017 >Mucosal thickening involving the paranasal sinuses, without acute fluid level  - 03/02/2017    continue symb 80 2bid     - 03/22/2017  After extensive coaching HFA effectiveness =  75% (Ti short )  - 03/22/2017 flutter valve    I also strongly suspect that the airway issue is not going to improve without resolution of the achalasia. I certainly would not postpone surgery based on her lung disease therefore which is a consequence of her UGI problem.  Each maintenance medication was reviewed in detail including most importantly the difference between maintenance and as needed and under what circumstances the prns are to be used.  Please see AVS for specific  Instructions which are unique to this visit and I personally typed out  which were reviewed in detail in writing with the patient and a copy provided.

## 2017-03-24 ENCOUNTER — Ambulatory Visit: Payer: BC Managed Care – PPO | Admitting: Adult Health

## 2017-03-29 ENCOUNTER — Ambulatory Visit: Payer: BC Managed Care – PPO | Admitting: Adult Health

## 2017-04-11 ENCOUNTER — Ambulatory Visit: Payer: Self-pay | Admitting: General Surgery

## 2017-04-25 ENCOUNTER — Telehealth: Payer: Self-pay | Admitting: *Deleted

## 2017-04-25 NOTE — Telephone Encounter (Signed)
She just needs to hold the Xarelto the day before surgery. If all goes smoothly, she can resume night of surgery. If surgery done late in day, then resume next morning. You can give them my cell phone # if they have questions.

## 2017-04-25 NOTE — Telephone Encounter (Signed)
[facility] office - stated pt is planning on having oral surgery and wants to know how to hold Xarelto before the surgery. Pt has not schedule the surgery yet b/c has something else going on - they trying to plan ahead.

## 2017-04-26 NOTE — Telephone Encounter (Signed)
[name] from Dr Clemetine Markerehm's called - informed "She just needs to hold the Xarelto the day before surgery. If all goes smoothly, she can resume night of surgery. If surgery done late in day, then resume next morning." per Dr Frederich ChickGranfotuna. Also given Dr Timoteo ExposeG's cell#.

## 2017-04-26 NOTE — Telephone Encounter (Signed)
Called [name] - not available, left message to call me back.

## 2017-05-09 ENCOUNTER — Ambulatory Visit: Payer: Self-pay | Admitting: General Surgery

## 2017-05-09 ENCOUNTER — Telehealth: Payer: Self-pay | Admitting: General Surgery

## 2017-05-09 NOTE — Telephone Encounter (Signed)
Spoke with her about scheduling surgery.  Will start the process.  Will have her stop Xarelto 4 days before surgery and start Lovenox injections (60 mg) as suggested by Dr. Cyndie ChimeGranfortuna.

## 2017-05-23 ENCOUNTER — Ambulatory Visit (INDEPENDENT_AMBULATORY_CARE_PROVIDER_SITE_OTHER): Payer: BC Managed Care – PPO | Admitting: Internal Medicine

## 2017-05-23 ENCOUNTER — Encounter: Payer: Self-pay | Admitting: Internal Medicine

## 2017-05-23 VITALS — BP 116/80 | HR 89 | Ht 69.0 in | Wt 214.8 lb

## 2017-05-23 DIAGNOSIS — J45991 Cough variant asthma: Secondary | ICD-10-CM | POA: Diagnosis not present

## 2017-05-23 MED ORDER — TRAMADOL HCL 50 MG PO TABS: ORAL_TABLET | ORAL | 0 refills | Status: DC

## 2017-05-23 MED ORDER — CEFDINIR 300 MG PO CAPS: 300.0000 mg | ORAL_CAPSULE | Freq: Two times a day (BID) | ORAL | 1 refills | Status: DC

## 2017-05-23 MED ORDER — BUDESONIDE-FORMOTEROL FUMARATE 80-4.5 MCG/ACT IN AERO: 2.0000 | INHALATION_SPRAY | Freq: Two times a day (BID) | RESPIRATORY_TRACT | 50 refills | Status: DC

## 2017-05-23 NOTE — Patient Instructions (Signed)
For cough > mucinex dm up to 1200 mg every 12 hours and add the flutter valve as much as possible and if can't stop coughing use tramadol 50 mg 1-2 every 4 hours as needed   Continue symbicort 80 Take 2 puffs first thing in am and then another 2 puffs about 12 hours later.    Work on inhaler technique:  relax and gently blow all the way out then take a nice smooth deep breath back in, triggering the inhaler at same time you start breathing in.  Hold for up to 5 seconds if you can. Blow out thru nose. Rinse and gargle with water when done  ominicef 300 mg twice daily x 10 daily - ok to repeat if needed between now and surgery

## 2017-05-23 NOTE — Progress Notes (Signed)
Subjective:   Patient ID: Shelley Reese, female    DOB: 11/10/1966,    MRN: [id number]    Brief patient profile:   [id number]  yowf TA/ kindergarden light smoker quit  2004   [place] allergies or asthma occ bad cough about half the time needed to see doctor to get over it, referred by Dr [name] for eval of dust exp after 9/11 attacks in 2001 with - PFT's 11/26/11 WNL x dlco 69 % corrects to 123   History of Present Illness  11/26/2011  1st pulmonary evaluation for recurrent wheezing with colds  Onset in the 1990's rx with prednisone for maybe 10days,  No maint rx maybe a couple times a year plus abx and cough meds mostly in winter then moved to [place] in 2005 same problem s prednisone because wasn't wheezing but present exacerbation of cough started early November 2012 rx with advair / albuterol  p  "caught daughter's cold" zpak and levaquin never prednsione and never better.   Cough is daily,  dry,  More when  supine but also triggered   at [employer].  Started Zegrid otc no better.  Assoc with sense of nasal congestion but not itching or sneezing. Assoc with sence of throat fullness/ mild dysphagia. Not sob.  rec The key to effective treatment for your cough is eliminating the non-stop cycle of cough you're stuck in long enough to let your airway heal completely and then see if there is anything still making you cough once you stop the cough suppression, but this should take no more than 5 days to figuure out First take delsym two tsp every 12 hours and supplement if needed with  tramadol 50 mg up to 2 every 4 hours to suppress the urge to cough..   Try Nexium 40  Take 30-60 min before first meal of the day and Zergrid 20 mg one bedtime until cough is completely gone for at least a week without the need for cough suppression Prednisone 10 mg take  4 each am x 2 days,   2 each am x 2 days,  1 each am x2days and stop       12/10/2011 f/u ov/Shelley Reese cc cough much better, no use of  suppression, still cough on exp to heavy fumes but this was true before Nov  2012 so feels completely back to baseline now with no need for cough suppression, no sob rec Consider stopping your hormone replacement for a month since this can contribute to GERD Definitely continue nexium 40 mg Take 30-60 min before first meal of the day and Zegerid at bedtime for at least 3 months then defer to Dr [name] regarding longterm medical therapy or referral to a GI doctor  If the cough continues to be a problem next step asthma challenge ideally should not be done while on hormone replacement therapy because it causes false positives (as does reflux) but if you want to have this done call Almyra FreeLibby and schedule it at [phone]> never done .        10/06/2016   [place] [facility] office visit/ re-establish / [name]  Re recurrent cough  Chief Complaint  Patient presents with  . Pulmonary Consult    Referred by Boneta LucksJennifer Brown, PA. Pt c/o cough off and on for the past year. She states she has had PNA several times in 2017. She states her cough is occ prod with yellow to green sputum. She coughs until she vomits.  last abx x one month prior to OV then sick again with severe cough / green mucus and cough to vomiting  Sob mostly when coughing/ her blue inhaler helps some (dulera) rec omnicef 300 mg one twice daily x 10 days  Best cough med  > mucined dm up to 1200 mg every 12 hours as needed For wheeze > dulera up to 2 puff every 12 hours as needed GERD  Diet After the first of the year you will need a sinus ct and high resolution ct chest> delayed due to insurance      02/11/2017  f/u ov/Shelley Reese re:  Cough/ sob  Transiently better p last ov / only rx = dexilant  Chief Complaint  Patient presents with  . Follow-up    Recently had a CT test done. Has bronchitis for the past 2 months.    around 1st Dec 2018  L neck pain dx as dvt  Breathing gradually worse with cough most productive at hs and in am since off dulera   Cough worse in am and again productive of green mucus  rec Omnicef 300 mg twice daily x 10 days then sinus CT after finish but before return  Symbicort 80 Take 2 puffs first thing in am and then another 2 puffs about 12 hours later.  Prednisone 10 mg take  4 each am x 2 days,   2 each am x 2 days,  1 each am x 2 days and stop  Dexilant 60 mg   Take  30-60 min before first meal of the day and Pepcid (famotidine)  20 mg one @  bedtime until return to office - this is the best way to tell whether stomach acid is contributing to your problem.        03/02/2017  f/u ov/Shelley Reese re: chronic cough/ infiltrates recent dx achalasia  Chief Complaint  Patient presents with  . Follow-up    Pt feels like her breathing has been better and the cough is better   all smiles/ Not limited by breathing from desired activities on symb 80 2bid    rec Continue symbicort 80 Take 2 puffs first thing in am and then another 2 puffs about 12 hours later.  Work on Chemical engineer technique:  relax and gently blow all the way out then take a nice smooth deep breath back in, triggering the inhaler at same time you start breathing in.  Hold for up to 5 seconds if you can. Blow out thru nose. 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 Follow-up     3 week follow up, pt feels ok but she has been diagnosed with alcolasia and is wondering what she can do since her surgery is not until june   harsh barking/ honking cough / minimally productive and more day than noct rec For cough > mucinex dm up to 1200 mg every 12 hours and add the flutter valve as much as possible Work on inhaler technique:      05/23/2017 acute extended ov/Kischa Altice re: acute cough in pt with  Achalasia/ PF attributed  To asp/ GERD  Chief Complaint  Patient presents with  . Acute Visit    Pt states "the  same usual problem"- increased cough, wheezing, SOB and chest tightness x 2 wks. Her cough is  prod with yellow sputum. She is sleeping propped up on 2-3 pillows and not sleeping well.   was doing well  Then caught a head cold 2 w prior to OV  Then gradually worse severe cough to point of vomiting worse at hs, mucus turning dark yellow esp in am and sob/ tightness assoc with coughing fits  Not using symbicort correctly, not using flutter at all / some better p ":phenergan " not on med list    No obvious day to day or daytime variability or assoc   mucus plugs or hemoptysis or cp  or overt  hb symptoms. No unusual exp hx or h/o childhood pna/ asthma or knowledge of premature birth.  Sleeping ok without nocturnal  or early am exacerbation  of respiratory  c/o's or need for noct saba. Also denies any obvious fluctuation of symptoms with weather or environmental changes or other aggravating or alleviating factors except as outlined above   Current Medications, Allergies, Complete Past Medical History, Past Surgical History, Family History, and Social History were reviewed in [employer] record.  ROS  The following are not active complaints unless bolded sore throat, dysphagia, dental problems, itching, sneezing,  nasal congestion or excess/ purulent secretions, ear ache,   fever, chills, sweats, unintended wt loss, classically pleuritic or exertional cp,  orthopnea pnd or leg swelling, presyncope, palpitations, abdominal pain, anorexia, nausea, vomiting, diarrhea  or change in bowel or bladder habits, change in stools or urine, dysuria,hematuria,  rash, arthralgias, visual complaints, headache, numbness, weakness or ataxia or problems with walking or coordination,  change in mood/affect or memory.              Objective:   Physical Exam  Pleasantly anxious amb wf   05/23/2017        214 03/22/2017        215  03/02/2017         217  02/11/2017         218   10/06/16 220 lb  (99.8 kg)  12/19/12 236 lb (107 kg)  12/10/11 229 lb (103.9 kg)    Vital signs reviewed - Note on arrival 02 sats  98% on RA    HEENT: nl dentition, turbinates, and oropharynx. Nl external ear canals without cough reflex   NECK :  without JVD/Nodes/TM/ nl carotid upstrokes bilaterally   LUNGS: no acc muscle use,  Nl contour chest with minimal  insp and exp  rhonchi bilaterally R > L   CV:  RRR  no s3 or murmur or increase in P2, no edema   ABD:  soft and nontender with nl inspiratory excursion in the supine position. No bruits or organomegaly, bowel sounds nl  MS:  Nl gait/ ext warm without deformities, calf tenderness, cyanosis or clubbing No obvious joint restrictions   SKIN: warm and dry without lesions    NEURO:  alert, approp, nl sensorium with  no motor deficits        Assessment & Plan:

## 2017-05-23 NOTE — Assessment & Plan Note (Signed)
-   Exposed to 08/16/00 dust but no chronic symptoms - PFT's 11/26/11 WNL x dlco 69 % corrects to 123 - 02/11/2017   > try symbicort 80 2bid  - Sinus CT 02/23/2017 >Mucosal thickening involving the paranasal sinuses, without acute fluid level  - 03/02/2017    continue symb 80 2bid     - 03/22/2017 flutter valve    - 05/23/2017  After extensive coaching HFA effectiveness =    75% (Ti too short, late trigger)    Acute flare of tracheobronchitis in pt with GERD/ achalasia  Of the three most common causes of  Sub-acute or recurrent or chronic cough, only one (GERD)  can actually contribute to/ trigger  the other two (asthma and post nasal drip syndrome)  and perpetuate the cylce of cough.  While not intuitively obvious, many patients with chronic low grade reflux do not cough until there is a primary insult that disturbs the protective epithelial barrier and exposes sensitive nerve endings.   This is typically viral as likely the case here but can be direct physical injury such as with an endotracheal tube.   The point is that once this occurs, it is difficult to eliminate the cycle  using anything but a maximally effective acid suppression regimen at least in the short run, accompanied by an appropriate diet to address non acid GERD and control / eliminate the cough itself for at least 3 days.   rec control cough with tramadol / rx ? Sinusitis/ tracheobronchitis with ominicef 300 bid up to 20 days preop  I had an extended discussion with the patient reviewing all relevant studies completed to date and  lasting 15 to 20 minutes of a 25 minute acute office visit    Each maintenance medication was reviewed in detail including most importantly the difference between maintenance and prns and under what circumstances the prns are to be triggered using an action plan format that is not reflected in the computer generated alphabetically organized AVS.    Please see AVS for specific instructions unique to this  visit that I personally wrote and verbalized to the the pt in detail and then reviewed with pt  by my nurse highlighting any  changes in therapy recommended at today's visit to their plan of care.

## 2017-05-26 ENCOUNTER — Telehealth: Payer: Self-pay | Admitting: *Deleted

## 2017-05-26 NOTE — Telephone Encounter (Signed)
Call from [facility], [facility] - pt is having surgery on July 19th and they need order for Lovenox bridge. Telephone# [phone]. Thanks

## 2017-05-30 ENCOUNTER — Ambulatory Visit: Payer: BC Managed Care – PPO | Admitting: Internal Medicine

## 2017-05-30 NOTE — Telephone Encounter (Signed)
Note sent to Dr Abbey Chattersosenbower - she is on Xarelto. Does not need a lovenox bridge.

## 2017-06-15 NOTE — Patient Instructions (Signed)
Shelley Reese  06/15/2017   Your procedure is scheduled on: 06-23-17  [facility]  Entrance [place]  elevators to 3rd floor to  Short Stay Center at 530AM.   Call this number if you have problems the morning of surgery [phone]   Remember: ONLY 1 PERSON MAY GO WITH YOU TO SHORT STAY TO GET  READY MORNING OF YOUR SURGERY.  Do not eat food or drink liquids :After Midnight.     Take these medicines the morning of surgery with A SIP OF WATER: paxil, dexilant, inhaler as needed ( may bring to hospital)                                 You may not have any metal on your body including hair pins and              piercings  Do not wear jewelry, make-up, lotions, powders or perfumes, deodorant             Do not wear nail polish.  Do not shave  48 hours prior to surgery.              Do not bring valuables to the hospital. [facility] IS NOT             RESPONSIBLE   FOR VALUABLES.  Contacts, dentures or bridgework may not be worn into surgery.  Leave suitcase in the car. After surgery it may be brought to your room.                Please read over the following fact sheets you were given: _____________________________________________________________________            [facility] - Preparing for Surgery Before surgery, you can play an important role.  Because skin is not sterile, your skin needs to be as free of germs as possible.  You can reduce the number of germs on your skin by washing with CHG (chlorahexidine gluconate) soap before surgery.  CHG is an antiseptic cleaner which kills germs and bonds with the skin to continue killing germs even after washing. Please DO NOT use if you have an allergy to CHG or antibacterial soaps.  If your skin becomes reddened/irritated stop using the CHG and inform your nurse when you arrive at Short Stay. Do not shave (including legs and underarms) for at least 48 hours prior to the first CHG shower.  You may  shave your face/neck. Please follow these instructions carefully:  1.  Shower with CHG Soap the night before surgery and the  morning of Surgery.  2.  If you choose to wash your hair, wash your hair first as usual with your  normal  shampoo.  3.  After you shampoo, rinse your hair and body thoroughly to remove the  shampoo.                           4.  Use CHG as you would any other liquid soap.  You can apply chg directly  to the skin and wash                       Gently with a scrungie or clean washcloth.  5.  Apply the CHG Soap to your  body ONLY FROM THE NECK DOWN.   Do not use on face/ open                           Wound or open sores. Avoid contact with eyes, ears mouth and genitals (private parts).                       Wash face,  Genitals (private parts) with your normal soap.             6.  Wash thoroughly, paying special attention to the area where your surgery  will be performed.  7.  Thoroughly rinse your body with warm water from the neck down.  8.  DO NOT shower/wash with your normal soap after using and rinsing off  the CHG Soap.                9.  Pat yourself dry with a clean towel.            10.  Wear clean pajamas.            11.  Place clean sheets on your bed the night of your first shower and do not  sleep with pets. Day of Surgery : Do not apply any lotions/deodorants the morning of surgery.  Please wear clean clothes to the hospital/surgery center.  FAILURE TO FOLLOW THESE INSTRUCTIONS MAY RESULT IN THE CANCELLATION OF YOUR SURGERY PATIENT SIGNATURE_________________________________  NURSE SIGNATURE__________________________________  ________________________________________________________________________

## 2017-06-15 NOTE — Progress Notes (Signed)
LOV internal med Palmer 01-10-17 epic  LOV Ocie Doynepulm Wert MD 05-23-17 epic   Stress test 12-19-12 epic "normal study"

## 2017-06-16 ENCOUNTER — Encounter (HOSPITAL_COMMUNITY)
Admission: RE | Admit: 2017-06-16 | Discharge: 2017-06-16 | Disposition: A | Discharge status: Home / Self Care | Payer: BC Managed Care – PPO | Source: Ambulatory Visit | Attending: General Surgery | Admitting: General Surgery

## 2017-06-16 ENCOUNTER — Encounter (HOSPITAL_COMMUNITY): Payer: Self-pay | Admitting: Emergency Medicine

## 2017-06-16 DIAGNOSIS — Z01812 Encounter for preprocedural laboratory examination: Secondary | ICD-10-CM | POA: Diagnosis present

## 2017-06-16 HISTORY — DX: Adverse effect of unspecified anesthetic, initial encounter: T41.45XA

## 2017-06-16 HISTORY — DX: Other complications of anesthesia, initial encounter: T88.59XA

## 2017-06-16 LAB — CBC WITH DIFFERENTIAL/PLATELET
Basophils Absolute: 0.1 10*3/uL (ref 0.0–0.1)
Basophils Relative: 1 %
EOS ABS: 0.4 10*3/uL (ref 0.0–0.7)
Eosinophils Relative: 6 %
HEMATOCRIT: 39 % (ref 36.0–46.0)
HEMOGLOBIN: 12.7 g/dL (ref 12.0–15.0)
LYMPHS ABS: 1.7 10*3/uL (ref 0.7–4.0)
LYMPHS PCT: 24 %
MCH: 26.5 pg (ref 26.0–34.0)
MCHC: 32.6 g/dL (ref 30.0–36.0)
MCV: 81.3 fL (ref 78.0–100.0)
MONOS PCT: 7 %
Monocytes Absolute: 0.5 10*3/uL (ref 0.1–1.0)
NEUTROS ABS: 4.3 10*3/uL (ref 1.7–7.7)
NEUTROS PCT: 62 %
Platelets: 382 10*3/uL (ref 150–400)
RBC: 4.8 MIL/uL (ref 3.87–5.11)
RDW: 14 % (ref 11.5–15.5)
WBC: 6.9 10*3/uL (ref 4.0–10.5)

## 2017-06-16 LAB — COMPREHENSIVE METABOLIC PANEL
ALK PHOS: 79 U/L (ref 38–126)
ALT: 28 U/L (ref 14–54)
AST: 24 U/L (ref 15–41)
Albumin: 4.1 g/dL (ref 3.5–5.0)
Anion gap: 10 (ref 5–15)
BILIRUBIN TOTAL: 0.4 mg/dL (ref 0.3–1.2)
BUN: 14 mg/dL (ref 6–20)
CALCIUM: 9.4 mg/dL (ref 8.9–10.3)
CO2: 27 mmol/L (ref 22–32)
CREATININE: 0.8 mg/dL (ref 0.44–1.00)
Chloride: 105 mmol/L (ref 101–111)
GFR calc non Af Amer: >60 mL/min (ref >60)
GLUCOSE: 93 mg/dL (ref 65–99)
Potassium: 4 mmol/L (ref 3.5–5.1)
SODIUM: 142 mmol/L (ref 135–145)
TOTAL PROTEIN: 8 g/dL (ref 6.5–8.1)

## 2017-06-16 LAB — PROTIME-INR
INR: 1.32
Prothrombin Time: 16.5 seconds — ABNORMAL HIGH (ref 11.4–15.2)

## 2017-06-16 LAB — HCG, SERUM, QUALITATIVE: PREG SERUM: NEGATIVE

## 2017-06-16 LAB — ABO/RH: ABO/RH(D): B POS

## 2017-06-16 NOTE — Progress Notes (Signed)
At pre-op visit Shelley Reese reports that she was told by Dr Abbey Chattersosenbower that she would need to bridge her xarelto with Lovenox for surgery but no one has sent her a prescription for it. RN advised Shelley Reese she would need to speak with the physician that manages her xarelto to determine how best to manage xarelto before surgery. Pt verbalize understanding and states she will call their office after her appt and find out

## 2017-06-21 ENCOUNTER — Ambulatory Visit: Payer: BC Managed Care – PPO | Admitting: Internal Medicine

## 2017-06-22 NOTE — Anesthesia Preprocedure Evaluation (Addendum)
Anesthesia Evaluation  Patient identified by MRN, date of birth, ID band Patient awake    Reviewed: Allergy & Precautions, NPO status , Patient's Chart, lab work & pertinent test results  Airway Mallampati: III  TM Distance: >3 FB Neck ROM: Full    Dental  (+) Dental Advisory Given   Pulmonary asthma , former smoker,    breath sounds clear to auscultation       Cardiovascular + Peripheral Vascular Disease   Rhythm:Regular Rate:Normal     Neuro/Psych negative neurological ROS     GI/Hepatic Neg liver ROS, GERD  ,achalasia   Endo/Other  negative endocrine ROS  Renal/GU negative Renal ROS     Musculoskeletal   Abdominal   Peds  Hematology  (+) Blood dyscrasia (Hx dvt on xarelto), ,   Anesthesia Other Findings   Reproductive/Obstetrics                            Lab Results  Component Value Date   WBC 6.9 06/16/2017   HGB 12.7 06/16/2017   HCT 39.0 06/16/2017   MCV 81.3 06/16/2017   PLT 382 06/16/2017   Lab Results  Component Value Date   CREATININE 0.80 06/16/2017   BUN 14 06/16/2017   NA 142 06/16/2017   K 4.0 06/16/2017   CL 105 06/16/2017   CO2 27 06/16/2017    Anesthesia Physical Anesthesia Plan  ASA: II  Anesthesia Plan: General   Post-op Pain Management:    Induction: Intravenous  PONV Risk Score and Plan: 4 or greater and Ondansetron, Dexamethasone, Propofol, Midazolam and Scopolamine patch - Pre-op  Airway Management Planned: Oral ETT  Additional Equipment:   Intra-op Plan:   Post-operative Plan: Extubation in OR  Informed Consent: I have reviewed the patients History and Physical, chart, labs and discussed the procedure including the risks, benefits and alternatives for the proposed anesthesia with the patient or authorized representative who has indicated his/her understanding and acceptance.   Dental advisory given  Plan Discussed with:  CRNA  Anesthesia Plan Comments:        Anesthesia Quick Evaluation

## 2017-06-23 ENCOUNTER — Ambulatory Visit (HOSPITAL_COMMUNITY): Payer: BC Managed Care – PPO | Admitting: Anesthesiology

## 2017-06-23 ENCOUNTER — Encounter (HOSPITAL_COMMUNITY)
Admission: AD | Disposition: A | Discharge status: Home / Self Care | Payer: Self-pay | Source: Ambulatory Visit | Attending: General Surgery

## 2017-06-23 ENCOUNTER — Observation Stay (HOSPITAL_COMMUNITY)
Admission: AD | Admit: 2017-06-23 | Discharge: 2017-06-25 | DRG: 328 | Disposition: A | Discharge status: Home / Self Care | Payer: BC Managed Care – PPO | Source: Ambulatory Visit | Attending: General Surgery | Admitting: General Surgery

## 2017-06-23 ENCOUNTER — Encounter (HOSPITAL_COMMUNITY): Payer: Self-pay | Admitting: *Deleted

## 2017-06-23 DIAGNOSIS — J45909 Unspecified asthma, uncomplicated: Secondary | ICD-10-CM | POA: Insufficient documentation

## 2017-06-23 DIAGNOSIS — I82622 Acute embolism and thrombosis of deep veins of left upper extremity: Secondary | ICD-10-CM | POA: Diagnosis present

## 2017-06-23 DIAGNOSIS — K220 Achalasia of cardia: Secondary | ICD-10-CM | POA: Diagnosis present

## 2017-06-23 DIAGNOSIS — Z7951 Long term (current) use of inhaled steroids: Secondary | ICD-10-CM | POA: Insufficient documentation

## 2017-06-23 DIAGNOSIS — X58XXXA Exposure to other specified factors, initial encounter: Secondary | ICD-10-CM | POA: Insufficient documentation

## 2017-06-23 DIAGNOSIS — Z7901 Long term (current) use of anticoagulants: Secondary | ICD-10-CM | POA: Insufficient documentation

## 2017-06-23 DIAGNOSIS — K219 Gastro-esophageal reflux disease without esophagitis: Secondary | ICD-10-CM | POA: Insufficient documentation

## 2017-06-23 DIAGNOSIS — T18128A Food in esophagus causing other injury, initial encounter: Secondary | ICD-10-CM | POA: Diagnosis not present

## 2017-06-23 DIAGNOSIS — Z87891 Personal history of nicotine dependence: Secondary | ICD-10-CM | POA: Diagnosis not present

## 2017-06-23 DIAGNOSIS — Z79899 Other long term (current) drug therapy: Secondary | ICD-10-CM | POA: Insufficient documentation

## 2017-06-23 DIAGNOSIS — Z86718 Personal history of other venous thrombosis and embolism: Secondary | ICD-10-CM | POA: Insufficient documentation

## 2017-06-23 HISTORY — PX: HELLER MYOTOMY: SHX5259

## 2017-06-23 LAB — TYPE AND SCREEN
ABO/RH(D): B POS
Antibody Screen: NEGATIVE

## 2017-06-23 SURGERY — ESOPHAGOMYOTOMY, LAPAROSCOPIC, HELLER
Anesthesia: General

## 2017-06-23 MED ORDER — STERILE WATER FOR IRRIGATION IR SOLN
Status: DC | PRN
  Administered 2017-06-23: 1000 mL

## 2017-06-23 MED ORDER — PANTOPRAZOLE SODIUM 40 MG IV SOLR
40.0000 mg | Freq: Every day | INTRAVENOUS | Status: DC
  Administered 2017-06-23 – 2017-06-24 (×2): 40 mg via INTRAVENOUS
  Filled 2017-06-23 (×2): qty 40

## 2017-06-23 MED ORDER — PROPOFOL 10 MG/ML IV BOLUS
INTRAVENOUS | Status: AC
  Filled 2017-06-23: qty 40

## 2017-06-23 MED ORDER — LIDOCAINE 2% (20 MG/ML) 5 ML SYRINGE
INTRAMUSCULAR | Status: AC
  Filled 2017-06-23: qty 20

## 2017-06-23 MED ORDER — VANCOMYCIN HCL IN DEXTROSE 1-5 GM/200ML-% IV SOLN: 1000.0000 mg | INTRAVENOUS | Status: DC

## 2017-06-23 MED ORDER — ROCURONIUM BROMIDE 50 MG/5ML IV SOSY
PREFILLED_SYRINGE | INTRAVENOUS | Status: AC
  Filled 2017-06-23: qty 5

## 2017-06-23 MED ORDER — ONDANSETRON HCL 4 MG/2ML IJ SOLN: 4.0000 mg | INTRAMUSCULAR | Status: DC | PRN

## 2017-06-23 MED ORDER — CHLORHEXIDINE GLUCONATE CLOTH 2 % EX PADS: 6.0000 | MEDICATED_PAD | Freq: Once | CUTANEOUS | Status: DC

## 2017-06-23 MED ORDER — BUPIVACAINE HCL (PF) 0.5 % IJ SOLN
INTRAMUSCULAR | Status: AC
  Filled 2017-06-23: qty 30

## 2017-06-23 MED ORDER — DEXAMETHASONE SODIUM PHOSPHATE 10 MG/ML IJ SOLN
INTRAMUSCULAR | Status: DC | PRN
  Administered 2017-06-23: 10 mg via INTRAVENOUS

## 2017-06-23 MED ORDER — LACTATED RINGERS IR SOLN
Status: DC | PRN
  Administered 2017-06-23: 1000 mL

## 2017-06-23 MED ORDER — ONDANSETRON HCL 4 MG/2ML IJ SOLN
INTRAMUSCULAR | Status: AC
  Filled 2017-06-23: qty 2

## 2017-06-23 MED ORDER — BUPIVACAINE HCL (PF) 0.5 % IJ SOLN
INTRAMUSCULAR | Status: DC | PRN
  Administered 2017-06-23: 22 mL

## 2017-06-23 MED ORDER — HYDROMORPHONE HCL-NACL 0.5-0.9 MG/ML-% IV SOSY
PREFILLED_SYRINGE | INTRAVENOUS | Status: AC
  Filled 2017-06-23: qty 1

## 2017-06-23 MED ORDER — SCOPOLAMINE 1 MG/3DAYS TD PT72
MEDICATED_PATCH | TRANSDERMAL | Status: AC
  Filled 2017-06-23: qty 1

## 2017-06-23 MED ORDER — ROCURONIUM BROMIDE 10 MG/ML (PF) SYRINGE
PREFILLED_SYRINGE | INTRAVENOUS | Status: DC | PRN
  Administered 2017-06-23: 20 mg via INTRAVENOUS
  Administered 2017-06-23: 50 mg via INTRAVENOUS
  Administered 2017-06-23: 5 mg via INTRAVENOUS
  Administered 2017-06-23 (×2): 10 mg via INTRAVENOUS
  Administered 2017-06-23: 20 mg via INTRAVENOUS
  Administered 2017-06-23: 10 mg via INTRAVENOUS
  Administered 2017-06-23: 5 mg via INTRAVENOUS
  Administered 2017-06-23: 10 mg via INTRAVENOUS

## 2017-06-23 MED ORDER — ROCURONIUM BROMIDE 50 MG/5ML IV SOSY
PREFILLED_SYRINGE | INTRAVENOUS | Status: AC
  Filled 2017-06-23: qty 10

## 2017-06-23 MED ORDER — FENTANYL CITRATE (PF) 100 MCG/2ML IJ SOLN
INTRAMUSCULAR | Status: DC | PRN
  Administered 2017-06-23 (×2): 25 ug via INTRAVENOUS
  Administered 2017-06-23: 100 ug via INTRAVENOUS

## 2017-06-23 MED ORDER — ENOXAPARIN SODIUM 40 MG/0.4ML ~~LOC~~ SOLN
40.0000 mg | SUBCUTANEOUS | Status: DC
  Administered 2017-06-24 – 2017-06-25 (×2): 40 mg via SUBCUTANEOUS
  Filled 2017-06-23 (×2): qty 0.4

## 2017-06-23 MED ORDER — 0.9 % SODIUM CHLORIDE (POUR BTL) OPTIME
TOPICAL | Status: DC | PRN
  Administered 2017-06-23: 1000 mL

## 2017-06-23 MED ORDER — SUCCINYLCHOLINE CHLORIDE 200 MG/10ML IV SOSY
PREFILLED_SYRINGE | INTRAVENOUS | Status: DC | PRN
  Administered 2017-06-23: 120 mg via INTRAVENOUS

## 2017-06-23 MED ORDER — ONDANSETRON HCL 4 MG/2ML IJ SOLN
4.0000 mg | INTRAMUSCULAR | Status: AC
  Administered 2017-06-23 (×2): 4 mg via INTRAVENOUS
  Filled 2017-06-23 (×2): qty 2

## 2017-06-23 MED ORDER — VANCOMYCIN HCL IN DEXTROSE 1-5 GM/200ML-% IV SOLN
1000.0000 mg | INTRAVENOUS | Status: AC
  Administered 2017-06-23: 1000 mg via INTRAVENOUS

## 2017-06-23 MED ORDER — EPHEDRINE SULFATE-NACL 50-0.9 MG/10ML-% IV SOSY
PREFILLED_SYRINGE | INTRAVENOUS | Status: DC | PRN
  Administered 2017-06-23: 5 mg via INTRAVENOUS

## 2017-06-23 MED ORDER — PROMETHAZINE HCL 25 MG/ML IJ SOLN: 6.2500 mg | INTRAMUSCULAR | Status: DC | PRN

## 2017-06-23 MED ORDER — MORPHINE SULFATE (PF) 2 MG/ML IV SOLN
2.0000 mg | INTRAVENOUS | Status: DC | PRN
  Administered 2017-06-23: 4 mg via INTRAVENOUS
  Administered 2017-06-23: 2 mg via INTRAVENOUS
  Administered 2017-06-24 (×2): 4 mg via INTRAVENOUS
  Filled 2017-06-23: qty 1
  Filled 2017-06-23 (×3): qty 2

## 2017-06-23 MED ORDER — LABETALOL HCL 5 MG/ML IV SOLN
5.0000 mg | Freq: Once | INTRAVENOUS | Status: AC
  Administered 2017-06-23: 5 mg via INTRAVENOUS

## 2017-06-23 MED ORDER — ONDANSETRON HCL 4 MG/2ML IJ SOLN
INTRAMUSCULAR | Status: DC | PRN
  Administered 2017-06-23: 4 mg via INTRAVENOUS

## 2017-06-23 MED ORDER — SUCCINYLCHOLINE CHLORIDE 200 MG/10ML IV SOSY
PREFILLED_SYRINGE | INTRAVENOUS | Status: AC
  Filled 2017-06-23: qty 10

## 2017-06-23 MED ORDER — FENTANYL CITRATE (PF) 100 MCG/2ML IJ SOLN
INTRAMUSCULAR | Status: AC
  Filled 2017-06-23: qty 2

## 2017-06-23 MED ORDER — PROMETHAZINE HCL 25 MG/ML IJ SOLN: 12.5000 mg | INTRAMUSCULAR | Status: DC | PRN

## 2017-06-23 MED ORDER — HYDROMORPHONE HCL-NACL 0.5-0.9 MG/ML-% IV SOSY
0.2500 mg | PREFILLED_SYRINGE | INTRAVENOUS | Status: DC | PRN
  Administered 2017-06-23: 0.5 mg via INTRAVENOUS
  Administered 2017-06-23 (×2): 0.25 mg via INTRAVENOUS

## 2017-06-23 MED ORDER — VANCOMYCIN HCL IN DEXTROSE 1-5 GM/200ML-% IV SOLN
INTRAVENOUS | Status: AC
  Filled 2017-06-23: qty 200

## 2017-06-23 MED ORDER — DEXAMETHASONE SODIUM PHOSPHATE 10 MG/ML IJ SOLN
INTRAMUSCULAR | Status: AC
  Filled 2017-06-23: qty 1

## 2017-06-23 MED ORDER — MIDAZOLAM HCL 2 MG/2ML IJ SOLN
INTRAMUSCULAR | Status: AC
  Filled 2017-06-23: qty 2

## 2017-06-23 MED ORDER — PROPOFOL 10 MG/ML IV BOLUS
INTRAVENOUS | Status: DC | PRN
Start: 2017-06-23 — End: 2017-06-23
  Administered 2017-06-23: 200 mg via INTRAVENOUS

## 2017-06-23 MED ORDER — SUGAMMADEX SODIUM 200 MG/2ML IV SOLN
INTRAVENOUS | Status: DC | PRN
  Administered 2017-06-23: 200 mg via INTRAVENOUS

## 2017-06-23 MED ORDER — ALBUTEROL SULFATE (2.5 MG/3ML) 0.083% IN NEBU
INHALATION_SOLUTION | RESPIRATORY_TRACT | Status: AC
  Filled 2017-06-23: qty 3

## 2017-06-23 MED ORDER — EPHEDRINE 5 MG/ML INJ
INTRAVENOUS | Status: AC
  Filled 2017-06-23: qty 10

## 2017-06-23 MED ORDER — LIDOCAINE 2% (20 MG/ML) 5 ML SYRINGE
INTRAMUSCULAR | Status: DC | PRN
  Administered 2017-06-23: 50 mg via INTRAVENOUS
  Administered 2017-06-23: 20 mg via INTRAVENOUS
  Administered 2017-06-23: 80 mg via INTRAVENOUS

## 2017-06-23 MED ORDER — METHOCARBAMOL 500 MG PO TABS
500.0000 mg | ORAL_TABLET | Freq: Four times a day (QID) | ORAL | Status: DC
  Administered 2017-06-24 (×2): 500 mg via ORAL
  Filled 2017-06-23 (×4): qty 1

## 2017-06-23 MED ORDER — ALBUTEROL SULFATE (2.5 MG/3ML) 0.083% IN NEBU
2.5000 mg | INHALATION_SOLUTION | Freq: Once | RESPIRATORY_TRACT | Status: AC
  Administered 2017-06-23: 2.5 mg via RESPIRATORY_TRACT

## 2017-06-23 MED ORDER — MIDAZOLAM HCL 5 MG/5ML IJ SOLN
INTRAMUSCULAR | Status: DC | PRN
  Administered 2017-06-23: 2 mg via INTRAVENOUS

## 2017-06-23 MED ORDER — KCL IN DEXTROSE-NACL 20-5-0.9 MEQ/L-%-% IV SOLN
INTRAVENOUS | Status: DC
  Administered 2017-06-23 – 2017-06-24 (×3): via INTRAVENOUS
  Filled 2017-06-23 (×4): qty 1000

## 2017-06-23 MED ORDER — ONDANSETRON 4 MG PO TBDP
4.0000 mg | ORAL_TABLET | Freq: Four times a day (QID) | ORAL | Status: DC | PRN
Start: 2017-06-24 — End: 2017-06-25

## 2017-06-23 MED ORDER — LIDOCAINE 2% (20 MG/ML) 5 ML SYRINGE
INTRAMUSCULAR | Status: DC | PRN
  Administered 2017-06-23: 4.8 mL/h via INTRAVENOUS

## 2017-06-23 MED ORDER — LABETALOL HCL 5 MG/ML IV SOLN
INTRAVENOUS | Status: AC
  Filled 2017-06-23: qty 4

## 2017-06-23 MED ORDER — LIDOCAINE 2% (20 MG/ML) 5 ML SYRINGE
INTRAMUSCULAR | Status: AC
  Filled 2017-06-23: qty 5

## 2017-06-23 MED ORDER — LACTATED RINGERS IV SOLN
INTRAVENOUS | Status: DC | PRN
  Administered 2017-06-23 (×2): via INTRAVENOUS

## 2017-06-23 MED ORDER — MOMETASONE FURO-FORMOTEROL FUM 100-5 MCG/ACT IN AERO
2.0000 | INHALATION_SPRAY | Freq: Two times a day (BID) | RESPIRATORY_TRACT | Status: DC
  Filled 2017-06-23: qty 8.8

## 2017-06-23 SURGICAL SUPPLY — 57 items
APL SKNCLS STERI-STRIP NONHPOA (GAUZE/BANDAGES/DRESSINGS) ×1
APPLIER CLIP 5 13 M/L LIGAMAX5 (MISCELLANEOUS)
APPLIER CLIP ROT 10 11.4 M/L (STAPLE)
APR CLP MED LRG 11.4X10 (STAPLE)
APR CLP MED LRG 5 ANG JAW (MISCELLANEOUS)
BENZOIN TINCTURE PRP APPL 2/3 (GAUZE/BANDAGES/DRESSINGS) ×2 IMPLANT
CABLE HIGH FREQUENCY MONO STRZ (ELECTRODE) ×2 IMPLANT
CHLORAPREP W/TINT 26ML (MISCELLANEOUS) ×2 IMPLANT
CLIP APPLIE 5 13 M/L LIGAMAX5 (MISCELLANEOUS) IMPLANT
CLIP APPLIE ROT 10 11.4 M/L (STAPLE) IMPLANT
DECANTER SPIKE VIAL GLASS SM (MISCELLANEOUS) ×2 IMPLANT
DEVICE SUT QUICK LOAD TK 5 (STAPLE) ×9 IMPLANT
DEVICE SUT TI-KNOT TK 5X26 (MISCELLANEOUS) ×2 IMPLANT
DEVICE SUTURE ENDOST 10MM (ENDOMECHANICALS) ×2 IMPLANT
DISSECTOR BLUNT TIP ENDO 5MM (MISCELLANEOUS) ×2 IMPLANT
DRAIN CHANNEL 19F RND (DRAIN) IMPLANT
DRAIN PENROSE 18X1/2 LTX STRL (DRAIN) ×2 IMPLANT
DRSG TEGADERM 2-3/8X2-3/4 SM (GAUZE/BANDAGES/DRESSINGS) ×1 IMPLANT
ELECT REM PT RETURN 15FT ADLT (MISCELLANEOUS) ×2 IMPLANT
EVACUATOR SILICONE 100CC (DRAIN) IMPLANT
FILTER SMOKE EVAC LAPAROSHD (FILTER) ×2 IMPLANT
GAUZE SPONGE 2X2 8PLY STRL LF (GAUZE/BANDAGES/DRESSINGS) IMPLANT
GLOVE ECLIPSE 8.0 STRL XLNG CF (GLOVE) ×2 IMPLANT
GLOVE INDICATOR 8.0 STRL GRN (GLOVE) ×2 IMPLANT
GOWN STRL REUS W/TWL XL LVL3 (GOWN DISPOSABLE) ×8 IMPLANT
IRRIG SUCT STRYKERFLOW 2 WTIP (MISCELLANEOUS) ×2
IRRIGATION SUCT STRKRFLW 2 WTP (MISCELLANEOUS) ×1 IMPLANT
KIT BASIN OR (CUSTOM PROCEDURE TRAY) ×2 IMPLANT
MARKER SKIN DUAL TIP RULER LAB (MISCELLANEOUS) ×1 IMPLANT
PAD POSITIONING PINK XL (MISCELLANEOUS) ×2 IMPLANT
RELOAD ENDO STITCH (ENDOMECHANICALS) ×16 IMPLANT
RELOAD SUT TRIPLE-STITCH 2-0 (ENDOMECHANICALS) IMPLANT
SCISSORS LAP 5X35 DISP (ENDOMECHANICALS) ×2 IMPLANT
SET IRRIG TUBING LAPAROSCOPIC (IRRIGATION / IRRIGATOR) ×1 IMPLANT
SHEARS HARMONIC ACE PLUS 36CM (ENDOMECHANICALS) ×2 IMPLANT
SLEEVE XCEL OPT CAN 5 100 (ENDOMECHANICALS) ×6 IMPLANT
SPONGE DRAIN TRACH 4X4 STRL 2S (GAUZE/BANDAGES/DRESSINGS) IMPLANT
SPONGE GAUZE 2X2 STER 10/PKG (GAUZE/BANDAGES/DRESSINGS) ×1
STRIP CLOSURE SKIN 1/2X4 (GAUZE/BANDAGES/DRESSINGS) ×2 IMPLANT
SUT ETHILON 3 0 PS 1 (SUTURE) IMPLANT
SUT MNCRL AB 4-0 PS2 18 (SUTURE) ×2 IMPLANT
SUT SURGIDAC NAB ES-9 0 48 120 (SUTURE) ×6 IMPLANT
SYR 50ML LL SCALE MARK (SYRINGE) ×1 IMPLANT
TIP INNERVISION DETACH 40FR (MISCELLANEOUS) IMPLANT
TIP INNERVISION DETACH 50FR (MISCELLANEOUS) IMPLANT
TIP INNERVISION DETACH 56FR (MISCELLANEOUS) IMPLANT
TIPS INNERVISION DETACH 40FR (MISCELLANEOUS)
TOWEL OR 17X26 10 PK STRL BLUE (TOWEL DISPOSABLE) ×2 IMPLANT
TOWEL OR NON WOVEN STRL DISP B (DISPOSABLE) ×2 IMPLANT
TRAY FOLEY BAG SILVER LF 14FR (CATHETERS) IMPLANT
TRAY LAPAROSCOPIC (CUSTOM PROCEDURE TRAY) ×2 IMPLANT
TROCAR BLADELESS OPT 5 100 (ENDOMECHANICALS) ×2 IMPLANT
TROCAR XCEL BLUNT TIP 100MML (ENDOMECHANICALS) IMPLANT
TROCAR XCEL NON-BLD 11X100MML (ENDOMECHANICALS) ×2 IMPLANT
TUBING CONNECTING 10 (TUBING) ×1 IMPLANT
TUBING ENDO SMARTCAP PENTAX (MISCELLANEOUS) ×1 IMPLANT
TUBING INSUF HEATED (TUBING) ×2 IMPLANT

## 2017-06-23 NOTE — Discharge Instructions (Signed)
[facility]  ACHALASIA SURGERY AFTERCARE INSTRUCTIONS  Start on a Level 1 diet and do not advance (see below). No lifting, pushing or pulling over 10 pounds for 2 weeks and only if you are pain-free. Do not overeat! Walk frequently. You may drive when you are pain-free. Minimize bending and squatting. Call for high fever (>101.5), vomiting, inability to swallow, wound problems.   EATING AFTER YOUR HIATAL HERNIA REPAIR SURGERY  After your esophageal surgery, you can expect some difficulty swallowing.  If food sticks when you eat, it is called "dysphagia".  This is due to swelling around your surgery site and will most likely resolve within a few weeks.  To help you through this temporary phase, we start you out on a pureed diet.  Your first meal in the hospital was clear liquids.  You should have been given a pureed diet by the time you left the hospital.  We ask patients to stay on a pureed diet for the first two weeks to avoid anything getting "stuck" near your recent surgery.  Don't be alarmed if your ability to swallow doesn't progress according to this plan.  Everyone is different and some take longer or shorter.  Use common sense.  If you are having trouble swallowing a particular food, then avoid it.  If food is sticking when you advance your diet, go back to the previous day or two.  In general some simple rules to follow are:  Maintain an upright position (as near 90 degrees as possible) whenever eating or drinking.  Take small bites - only 1/2 to 1 teaspoon at a time.  Eat slowly.  It may also help to eat only one food at a time.  Avoid talking while eating.  Do not mix solid foods and liquids in the same mouthful and do not "wash foods down" with liquids, unless you have been instructed to do so by your surgeon.  Eat in a relaxed atmosphere, with no distractions.  Following each meal, sit in an upright position (90 degree angle) for 60-90  minutes.  Avoid carbonated (bubbly) drinks. Do not use straws.  If food does stick, don't panic.  Try to relax and let the food pass on its own.  Sipping strong hot black tea can also help.  If you have any questions please call our office at [phone].   LEVEL 1 PUREED FOODS:   Foods in this group are pureed or blenderized to a smooth, mashed potato-like consistency.  If necessary, the pureed foods can keep their shape with the addition of a thickening agent.  Meat should be pureed to a smooth pasty consistency.  Hot broth or gravy may be added to the pureed meat, approximately 1 oz. of liquid per 3 oz. serving of meat. CAUTION:  If any foods do not puree into a smooth consistency, it may make eating for swallowing more difficult.  For example, zucchini seeds sometimes do not blend well. Hot Foods Cold Foods  Pureed scrambled eggs and cheese Pureed cottage cheese  Baby cereals Thickened juices and nectars  Thinned cooked cereals (no lumps) Thickened milk or eggnog  Pureed [place] toast or pancakes Ensure  Mashed potatoes Ice cream  Pureed parsley, au gratin, scalloped potatoes, candied sweet potatoes Fruit or [place] ice, sherbet  Pureed buttered or alfredo noodles Plain yogurt  Pureed vegetables (no corn or peas) Instant breakfast  Pureed soups and creamed soups Smooth pudding, mousse, custard  Pureed scalloped apples Whipped gelatin  Gravies Sugar,  syrup, honey, jelly  Sauces, cheese, tomato, barbecue, white, creamed Cream  Any baby food Creamer  Alcohol in moderation (not beer or champagne) Margarine  Coffee or tea Mayonnaise   Ketchup, mustard   Apple sauce   SAMPLE MENU:  PUREED DIET Breakfast Lunch Dinner   Orange juice, 1/2 cup  Cream of wheat, 1/2 cup  Pineapple juice, 1/2 cup  Pureed [place], barley soup, 3/4 cup  Pureed Hawaiian chicken, 3 oz   Scrambled eggs, mashed or blended with cheese, 1/2 cup  Tea or coffee, 1 cup   Whole milk, 1 cup   Non-dairy  creamer, 2 Tbsp.  Mashed potatoes, 1/2 cup  Pureed cooled broccoli, 1/2 cup  Apple sauce, 1/2 cup  Coffee or tea  Mashed potatoes, 1/2 cup  Pureed spinach, 1/2 cup  Frozen yogurt, 1/2 cup  Tea or coffee    LEVEL 2 After your first 2 weeks, you can advance to a soft diet.  Keep on this diet until everything goes down easily. Hot Foods Cold Foods  White fish Cottage cheese  Stuffed fish Junior baby fruit  Baby food meals Semi thickened juices  Minced soft cooked, scrambled, poached eggs nectars  Souffle & omelets Ripe mashed bananas  Cooked cereals Canned fruit, pineapple sauce, milk  potatoes Milkshake  Buttered or Alfredo noodles Custard  Cooked cooled vegetable Puddings, including tapioca  Sherbet Yogurt  Vegetable soup or alphabet soup Fruit ice, [place] ice  Gravies Whipped gelatin  Sugar, syrup, honey, jelly Junior baby desserts  Sauces:  Cheese, creamed, barbecue, tomato, white Cream  Coffee or tea Margarine   SAMPLE MENU:  LEVEL 2 Breakfast Lunch Dinner   Orange juice, 1/2 cup  Oatmeal, 1/2 cup  Scrambled eggs with cheese, 1/2 cup  Decaffeinated tea, 1 cup  Whole milk, 1 cup  Non-dairy creamer, 2 Tbsp  Pineapple juice, 1/2 cup  Minced beef, 3 oz  Gravy, 2 Tbsp  Mashed potatoes, 1/2 cup  Minced fresh broccoli, 1/2 cup  Applesauce, 1/2 cup  Coffee, 1 cup  [place]key, barley soup, 3/4 cup  Minced Hawaiian chicken, 3 oz  Mashed potatoes, 1/2 cup  Cooked spinach, 1/2 cup  Frozen yogurt, 1/2 cup  Non-dairy creamer, 2 Tbsp    LEVEL 3 After all the foods in level 2 (soft diet) are passing through well you should advance up to the next level.  It is still important to cut these foods into small pieces and eat slowly. Hot Foods Cold Foods  Poultry Cottage cheese  Chopped Swedish meatballs Yogurt  Meat salads (ground or flaked meat) Milk  Flaked fish (tuna) Milkshakes  Poached or scrambled eggs Soft, cold, dry cereal  Souffles and omelets  Fruit juices or nectars  Cooked cereals Chopped canned fruit  Chopped [place] toast or pancakes Canned fruit cocktail  Noodles or pasta (no rice) Pudding, mousse, custard  Cooked vegetables (no frozen peas, corn, or mixed vegetables) Green salad  Canned small sweet peas Ice cream  Creamed soup or vegetable soup Fruit ice, [place] ice  Pureed vegetable soup or alphabet soup Non-dairy creamer  Ground scalloped apples Margarine  Gravies Mayonnaise  Sauces:  Cheese, creamed, barbecue, tomato, white Ketchup  Coffee or tea Mustard   SAMPLE MENU:  LEVEL 3 Breakfast Lunch Dinner   Orange juice, 1/2 cup  Oatmeal, 1/2 cup  Scrambled eggs with cheese, 1/2 cup  Decaffeinated tea, 1 cup  Whole milk, 1 cup  Non-dairy creamer, 2 Tbsp  Ketchup, 1 Tbsp  Margarine, 1 tsp  Salt, 1/4 tsp  Sugar, 2 tsp  Pineapple juice, 1/2 cup  Ground beef, 3 oz  Gravy, 2 Tbsp  Mashed potatoes, 1/2 cup  Cooked spinach, 1/2 cup  Applesauce, 1/2 cup  Decaffeinated coffee  Whole milk  Non-dairy creamer, 2 Tbsp  Margarine, 1 tsp  Salt, 1/4 tsp  Pureed [place], barley soup, 3/4 cup  Barbecue chicken, 3 oz  Mashed potatoes, 1/2 cup  Ground fresh broccoli, 1/2 cup  Frozen yogurt, 1/2 cup  Decaffeinated tea, 1 cup  Non-dairy creamer, 2 Tbsp  Margarine, 1 tsp  Salt, 1/4 tsp  Sugar, 1 tsp    LEVEL 4:  REGULAR FOODS Foods in this group are soft, moist, regularly textured foods.  This level includes red meat and breads, which tend to be the hardest things to swallow.  Eat very slow, chew well and continue to avoid carbonated drinks. Hot Foods Cold Foods  Baked fish or skinned Soft cheeses - cottage cheese  Souffles and omelets Cream cheese  Eggs Yogurt  Stuffed shells Milk  Spaghetti with meat sauce Milkshakes  Cooked cereal Cold dry cereals (no nuts, dried fruit, coconut)  [place] toast or pancakes Crackers  Buttered toast Fruit juices or nectars  Noodles or pasta (no rice)  Canned fruit  Potatoes (all types) Ripe bananas  Soft, cooked vegetables (no corn, lima, or baked beans) Peeled, ripe, fresh fruit  Creamed soups or vegetable soup Cakes (no nuts, dried fruit, coconut)  Canned chicken noodle soup Plain doughnuts  Gravies Ice cream  Bacon dressing Pudding, mousse, custard  Sauces:  Cheese, creamed, barbecue, tomato, white Fruit ice, [place] ice, sherbet  Decaffeinated tea or coffee Whipped gelatin  Pork chops Regular gelatin   Canned fruited gelatin molds   Sugar, syrup, honey, jam, jelly   Cream   Non-dairy   Margarine   Oil   Mayonnaise   Ketchup   Mustard

## 2017-06-23 NOTE — Anesthesia Postprocedure Evaluation (Signed)
Anesthesia Post Note  Patient: Shelley Reese  Procedure(s) Performed: Procedure(s) (LRB): LAPAROSCOPIC HELLER MYOTOMY AND DOR FUNDOPLICATION (N/A)     Patient location during evaluation: PACU Anesthesia Type: General Level of consciousness: awake and alert Pain management: pain level controlled Vital Signs Assessment: post-procedure vital signs reviewed and stable Respiratory status: spontaneous breathing, nonlabored ventilation, respiratory function stable and patient connected to nasal cannula oxygen Cardiovascular status: blood pressure returned to baseline and stable Postop Assessment: no signs of nausea or vomiting Anesthetic complications: no    Last Vitals:  Vitals:   06/23/17 1300 06/23/17 1320  BP: (!) 141/96 (!) 151/101  Pulse: 72 77  Resp: 11 12  Temp: 36.5 C 36.6 C    Last Pain:  Vitals:   06/23/17 1300  TempSrc:   PainSc: Ardean LarsenAsleep                 Kawehi Hostetter E

## 2017-06-23 NOTE — Op Note (Signed)
OPERATIVE NOTE-LAPAROSCOPIC HELLER MYOTOMY, DOR FUNDOPLICATION, INTRAOPERATIVE UPPER ENDOSCOPY  Preop Dx:  Achalasia  Postop:  Same  Procedure:  Laparoscopic Heller Myotomy, Dor fundoplication, intraoperative upper endoscopy.  Surgeon:  Avel Peaceodd Emiliana Blaize, M.D.  Asst:  Ovidio Kinavid Newman, M.D.  Anes:  General  EBL:  100 ml   Indication:  This is a 51 year old female with achalasia and progressive symptoms from it. She now presents for the above procedure.  Procedure Detail:  She was brought to the operating room and placed supine on the operating table and a general anesthetic was given. A Foley catheter was inserted. An oral gastric tube was inserted. The abdominal wall was widely sterilely prepped and draped. A timeout was performed.  Local anesthesia (Marcaine) was infiltrated in the left upper quadrant subcostal region. A 5 mm skin incision was made. Using a 5 mm Optiview trocar and laparoscope, access was gained to the peritoneal cavity and pneumoperitoneum created. Inspection of the area under the trocar demonstrated no evidence of organ injury or bleeding.  She was placed in reverse Trendelenburg position. A 5 trocar was placed in the right upper quadrant. Another  5 mm trocar was placed in the left upper quadrant. An 11 mm trocar was placed in the midepigastric area.  A 5 mm incision was made in the subxiphoid area and the self-retaining liver retractor was placed into the peritoneal cavity. This retracted the left lobe of liver anteriorly exposing the hiatus.   Using the harmonic scalpel, the gastrohepatic ligament was divided up to the level of the right crus. The phrenoesophageal ligament was divided exposing the anterior esophagus. I then divided short gastric vessels on the fundus up to the level of the left crus.  Inferior traction was placed on the GE junction. Using a combination of blunt dissection, electrocautery, and the Harmonic scalpel longitudinal fibers of the distal esophagus  were divided followed by circular fibers exposing the mucosa and heading superiorly approximately 6 cm. A 180 myotomy was performed. The myotomy was then extended inferiorly across the gastroesophageal junction and then onto the stomach for 2 cm.  At this time, upper endoscopy was performed by Dr. Ezzard StandingNewman. The area of the myotomy was put under irrigation fluid and there was no evidence of mucosal leak.  The gastroesophageal junction was widely patent.  Next, a Dor fundoplication was performed. Beginning on the left side, 2 nonabsorbable size 2-0 sutures were placed through the fundus of the stomach, the left crus, and the myotomy muscle fibers. A third suture was placed distal to these incorporating the fundus and the myotomy fibers.  The stomach was then placed anteriorly over the myotomy. 2 nonabsorbable size 0 sutures were placed to the fundus of the stomach, the myotomy fibers, and the right crus.  A third suture was placed distal to these incorporating the fundus and the myotomy fibers. A suture was then placed between the anterior stomach and the apex of the hiatus. There was no tension on the fundoplication.  The liver retractor was removed. A four quadrant and central inspection demonstrated no evidence of bleeding or organ injury. The subumbilical trocar was removed and the fascial defects closed under laparoscopic vision by tying down the pursestring suture. The remaining trocars were removed and the pneumoperitoneum released.  All skin incisions were closed with 4-0 Monocryl subcuticular stitches. Steri-Strips and sterile dressings were applied.  She tolerated the procedure well without any apparent complications and was taken to the recovery room in satisfactory condition.

## 2017-06-23 NOTE — Anesthesia Procedure Notes (Addendum)
Procedure Name: Intubation Date/Time: 06/23/2017 7:40 AM Performed by: Jerame Hedding, Virgel Gess Pre-anesthesia Checklist: Patient identified, Emergency Drugs available, Suction available, Patient being monitored and Timeout performed Patient Re-evaluated:Patient Re-evaluated prior to induction Oxygen Delivery Method: Circle system utilized Preoxygenation: Pre-oxygenation with 100% oxygen Induction Type: IV induction, Rapid sequence and Cricoid Pressure applied Ventilation: Mask ventilation without difficulty Laryngoscope Size: Mac and 4 Grade View: Grade II Tube type: Oral Tube size: 7.5 mm Number of attempts: 1 Airway Equipment and Method: Stylet Placement Confirmation: ETT inserted through vocal cords under direct vision,  positive ETCO2,  CO2 detector and breath sounds checked- equal and bilateral Secured at: 22 cm Tube secured with: Tape Dental Injury: Teeth and Oropharynx as per pre-operative assessment  Comments: White secretions noted in mouth, suctioned and ett placed, pt stated she did vomit this AM, Dr Zella Richer stated she had cheesecake last night, 228m suctioned from OG.

## 2017-06-23 NOTE — Interval H&P Note (Signed)
History and Physical Interval Note:  06/23/2017 7:25 AM  Shelley Reese  has presented today for surgery, with the diagnosis of achalasia  The various methods of treatment have been discussed with the patient and family. After consideration of risks, benefits and other options for treatment, the patient has consented to  Procedure(s): LAPAROSCOPIC HELLER MYOTOMY AND DOR FUNDOPLICATION (N/A) as a surgical intervention .  The patient's history has been reviewed, patient examined, no change in status, stable for surgery.  I have reviewed the patient's chart and labs.  Questions were answered to the patient's satisfaction.     Shelley Reese JShela Commons

## 2017-06-23 NOTE — Op Note (Signed)
Name:  Shelley Reese MRN: [id number] Date of Surgery: 06/23/2017  Preop Diagnosis:  Achalasia  Postop Diagnosis:  Achalasia, s/p laparoscopic Heller myotomy by Dr. Abbey Chattersosenbower  Procedure:  Upper endoscopy  (Intraoperative)  Surgeon:  Ovidio Kinavid Dreshon Proffit, M.D.  Anesthesia:  GET  Indications for procedure: Shelley Reese is a 51 y.o. female whose primary care physician is Boneta LucksBrown, Jennifer, NP and has achalasia.    She has had a laparoscopic Hellar myotomy by Dr. Abbey Chattersosenbower.  I am doing an intraoperative upper endoscopy to evaluate the adequacy of the myotomy and to look for mucosal injuries.  Operative Note: The patient is under general anesthesia.  Dr. Abbey Chattersosenbower is laparoscoping the patient while I do an upper endoscopy to evaluate the esophagus, myotomy, and stomach..  With the patient intubated, I passed the Olympus upper endoscope without difficulty down the esophagus.  The esophagus was somewhat dilated.  There was some residual food stuff in the esophagus.  The esophago-gastric junction was at 43 cm.   The esophago-gastric junction is now widely patent, the scope easily passed into the stomach.  And food material, that was in the esophagus has been pushed into the stomach  The mucosa of the stomach looked okay.   While I insufflated the distal esophagus and stomach with air, Dr. Abbey Chattersosenbower  flooded the upper abdomen with saline to put the myotomy under saline.  There was no bubbling or evidence of a leak.    The scope was then withdrawn.  The patient tolerated the endoscopy without difficulty.  Ovidio Kinavid Shelley Wenz, MD, [facility] Pager: [phone] Office phone:  [phone]

## 2017-06-23 NOTE — Transfer of Care (Signed)
Immediate Anesthesia Transfer of Care Note  Patient: Shelley Reese  Procedure(s) Performed: Procedure(s): LAPAROSCOPIC HELLER MYOTOMY AND DOR FUNDOPLICATION (N/A)  Patient Location: PACU  Anesthesia Type:General  Level of Consciousness:  sedated, patient cooperative and responds to stimulation  Airway & Oxygen Therapy:Patient Spontanous Breathing and Patient connected to face mask oxgen  Post-op Assessment:  Report given to PACU RN and Post -op Vital signs reviewed and stable  Post vital signs:  Reviewed and stable  Last Vitals:  Vitals:   06/23/17 0529  BP: (!) 141/90  Pulse: 79  Resp: 18  Temp: 36.7 C    Complications: No apparent anesthesia complications

## 2017-06-23 NOTE — H&P (Signed)
Shelley Reese is an 51 y.o. female.   Chief Complaint:  Here for elective surgery HPI: This is a 51 year old female who has progressive achalasia. She now presents for elective laparoscopic Heller myotomy and Dorr fundoplication. She has a history of deep venous thrombosis. She has been off her anticoagulation medication for an appropriate amount of time.  Past Medical History:  Diagnosis Date  . Acute deep vein thrombosis (DVT) of ulnar vein of left upper extremity (HCC) 01/10/2017   11/04/16 all veins from basilic/cephalic up through internal jugular; unprovoked  . Asthma   . Complication of anesthesia    following colonoscopy; spiked BP but was likely related to size of cuff; " one they gave me a bigger cuff it went right down "  . GERD (gastroesophageal reflux disease)   . Hoarseness of voice 01/10/2017   X1 year; reported at visit 01/10/17; nodules on voacal chords     Past Surgical History:  Procedure Laterality Date  . ESOPHAGEAL MANOMETRY N/A 03/09/2017   Procedure: ESOPHAGEAL MANOMETRY (EM);  Surgeon: Charna Elizabeth, MD;  Location: [facility];  Service: Endoscopy;  Laterality: N/A;  . VAGINAL DELIVERY      Family History  Problem Relation Age of Onset  . Heart disease Father   . Heart disease Mother   . Deep vein thrombosis Mother   . Lung cancer Mother        used to smoke, exposure to asbestos  . Mitral valve prolapse Mother   . Lupus Mother   . Allergies Brother   . Allergies Sister   . Colon cancer Paternal Grandmother   . Colon cancer Maternal Grandmother   . Liver cancer Maternal Grandmother   . Bone cancer Maternal Aunt   . Breast cancer Paternal Aunt    Social History:  reports that she quit smoking about 14 years ago. Her smoking use included Cigarettes. She has never used smokeless tobacco. She reports that she drinks alcohol. She reports that she does not use drugs.  Allergies:  Allergies  Allergen Reactions  . Amoxicillin Other (See Comments)    GI  Upset Has patient had a PCN reaction causing immediate rash, facial/tongue/throat swelling, SOB or lightheadedness with hypotension: No Has patient had a PCN reaction causing severe rash involving mucus membranes or skin necrosis: No Has patient had a PCN reaction that required hospitalization: No Has patient had a PCN reaction occurring within the last 10 years: Yes--GI upset occurred within 10 years. If all of the above answers are "NO", then may proceed with Cephalosporin use.     Medications Prior to Admission  Medication Sig Dispense Refill  . budesonide-formoterol (SYMBICORT) 80-4.5 MCG/ACT inhaler Inhale 2 puffs into the lungs 2 (two) times daily. 1 Inhaler 50  . cetirizine (ZYRTEC) 10 MG tablet Take 10 mg by mouth daily as needed for allergies.     Marland Kitchen dexlansoprazole (DEXILANT) 60 MG capsule Take 60 mg by mouth daily.    Marland Kitchen dextromethorphan-guaiFENesin (MUCINEX DM) 30-600 MG 12hr tablet Take 1 tablet by mouth 2 (two) times daily as needed for cough.    . famotidine (PEPCID) 20 MG tablet One at bedtime (Patient taking differently: Take 20 mg by mouth daily as needed for heartburn. ) 20 tablet 0  . PARoxetine (PAXIL) 10 MG tablet Take 10 mg by mouth daily.    Carlena Hurl 20 MG TABS tablet Take 20 mg by mouth daily with breakfast. with food  2  . cefdinir (OMNICEF) 300 MG capsule Take 1 capsule (  300 mg total) by mouth 2 (two) times daily. (Patient not taking: Reported on 06/15/2017) 20 capsule 1  . Respiratory Therapy Supplies (FLUTTER) DEVI As directed (Patient not taking: Reported on 06/15/2017) 1 each 0  . traMADol (ULTRAM) 50 MG tablet 1-2 every 4 hours as needed for cough or pain (Patient not taking: Reported on 06/15/2017) 40 tablet 0    No results found for this or any previous visit (from the past 48 hour(s)). No results found.  Review of Systems  Constitutional: Negative for chills and fever.  Gastrointestinal: Negative for abdominal pain and diarrhea.    Blood pressure (!)  141/90, pulse 79, temperature 98 F (36.7 C), temperature source Oral, resp. rate 18, height 5\' 9"  (1.753 m), weight 97.5 kg (215 lb), last menstrual period 05/17/2017, SpO2 98 %. Physical Exam  Constitutional:  Overweight female in no acute distress. Pleasant and cooperative.  HENT:  Head: Normocephalic and atraumatic.  Eyes: No scleral icterus.  Cardiovascular: Normal rate and regular rhythm.   Respiratory: Effort normal and breath sounds normal.  GI: She exhibits no distension and no mass. There is no tenderness.  Musculoskeletal: She exhibits no edema.  Neurological: She is alert.  Skin: Skin is warm and dry.  Psychiatric: She has a normal mood and affect. Her behavior is normal.     Assessment/Plan Progressive achalasia. Also has history of DVT.  Plan: Laparoscopic Heller myotomy and Dor fundoplication. We'll start anticoagulation as soon as we feel it is safe postoperatively.  Adolph PollackOSENBOWER,Jerson Furukawa J, MD 06/23/2017, 7:14 AM

## 2017-06-24 ENCOUNTER — Inpatient Hospital Stay (HOSPITAL_COMMUNITY): Payer: BC Managed Care – PPO

## 2017-06-24 DIAGNOSIS — K220 Achalasia of cardia: Secondary | ICD-10-CM | POA: Diagnosis not present

## 2017-06-24 LAB — BASIC METABOLIC PANEL
Anion gap: 8 (ref 5–15)
BUN: 11 mg/dL (ref 6–20)
CALCIUM: 8.9 mg/dL (ref 8.9–10.3)
CO2: 26 mmol/L (ref 22–32)
Chloride: 105 mmol/L (ref 101–111)
Creatinine, Ser: 0.59 mg/dL (ref 0.44–1.00)
GFR calc Af Amer: >60 mL/min (ref >60)
GLUCOSE: 121 mg/dL — AB (ref 65–99)
Potassium: 4.2 mmol/L (ref 3.5–5.1)
Sodium: 139 mmol/L (ref 135–145)

## 2017-06-24 LAB — CBC
HCT: 36.6 % (ref 36.0–46.0)
Hemoglobin: 11.9 g/dL — ABNORMAL LOW (ref 12.0–15.0)
MCH: 26.2 pg (ref 26.0–34.0)
MCHC: 32.5 g/dL (ref 30.0–36.0)
MCV: 80.4 fL (ref 78.0–100.0)
Platelets: 290 10*3/uL (ref 150–400)
RBC: 4.55 MIL/uL (ref 3.87–5.11)
RDW: 13.9 % (ref 11.5–15.5)
WBC: 14.5 10*3/uL — ABNORMAL HIGH (ref 4.0–10.5)

## 2017-06-24 MED ORDER — OXYCODONE HCL 5 MG PO TABS: 5.0000 mg | ORAL_TABLET | ORAL | 0 refills | Status: DC | PRN

## 2017-06-24 MED ORDER — OXYCODONE HCL 5 MG PO TABS
5.0000 mg | ORAL_TABLET | ORAL | Status: DC | PRN
  Administered 2017-06-24: 5 mg via ORAL
  Administered 2017-06-25 (×2): 10 mg via ORAL
  Filled 2017-06-24: qty 2
  Filled 2017-06-24: qty 1
  Filled 2017-06-24: qty 2

## 2017-06-24 MED ORDER — ACETAMINOPHEN 325 MG PO TABS
650.0000 mg | ORAL_TABLET | ORAL | Status: DC | PRN
  Administered 2017-06-24 – 2017-06-25 (×2): 650 mg via ORAL
  Filled 2017-06-24 (×2): qty 2

## 2017-06-24 MED ORDER — IOPAMIDOL (ISOVUE-300) INJECTION 61%
INTRAVENOUS | Status: AC
  Administered 2017-06-24: 150 mL via ORAL
  Filled 2017-06-24: qty 150

## 2017-06-24 NOTE — Progress Notes (Signed)
Assessment Principal Problem:   Achalasia s/p laparoscopic Heller myotomy an Dor fundoplication 06/23/17-stable overnight Active Problems:   Acute deep vein thrombosis (DVT) of ulnar vein of left upper extremity (HCC)-Xarelto on hold   Plan:  UGI this AM.  If okay, start full liquid diet.  We discussed aftercare instructions.   LOS: 1 day     1 Day Post-Op  Chief Complaint/Subjective: She c/o of headache and shoulder pain.  Took Morphine for this and slept fairly well.  Minimal nausea.  Objective: Vital signs in last 24 hours: Temp:  [97.7 F (36.5 C)-98.8 F (37.1 C)] 98 F (36.7 C) (07/20 0555) Pulse Rate:  [69-91] 77 (07/20 0555) Resp:  [10-19] 17 (07/20 0555) BP: (141-181)/(92-106) 157/92 (07/20 0555) SpO2:  [93 %-100 %] 95 % (07/20 0555) Last BM Date: 06/23/17  Intake/Output from previous day: 07/19 0701 - 07/20 0700 In: 2831.7 [I.V.:2831.7] Out: 2600 [Urine:2500; Blood:100] Intake/Output this shift: No intake/output data recorded.  PE: General- In NAD.  Awake and alert. Abdomen-soft, dressings dry  Lab Results:   Recent Labs  06/24/17 0542  WBC 14.5*  HGB 11.9*  HCT 36.6  PLT 290   BMET  Recent Labs  06/24/17 0542  NA 139  K 4.2  CL 105  CO2 26  GLUCOSE 121*  BUN 11  CREATININE 0.59  CALCIUM 8.9   PT/INR No results for input(s): LABPROT, INR in the last 72 hours. Comprehensive Metabolic Panel:    Component Value Date/Time   NA 139 06/24/2017 0542   NA 142 06/16/2017 1115   K 4.2 06/24/2017 0542   K 4.0 06/16/2017 1115   CL 105 06/24/2017 0542   CL 105 06/16/2017 1115   CO2 26 06/24/2017 0542   CO2 27 06/16/2017 1115   BUN 11 06/24/2017 0542   BUN 14 06/16/2017 1115   CREATININE 0.59 06/24/2017 0542   CREATININE 0.80 06/16/2017 1115   GLUCOSE 121 (H) 06/24/2017 0542   GLUCOSE 93 06/16/2017 1115   CALCIUM 8.9 06/24/2017 0542   CALCIUM 9.4 06/16/2017 1115   AST 24 06/16/2017 1115   AST 17 01/10/2017 1705   ALT 28 06/16/2017 1115    ALT 16 01/10/2017 1705   ALKPHOS 79 06/16/2017 1115   ALKPHOS 71 01/10/2017 1705   BILITOT 0.4 06/16/2017 1115   BILITOT 0.5 01/10/2017 1705   PROT 8.0 06/16/2017 1115   PROT 7.3 01/10/2017 1705   ALBUMIN 4.1 06/16/2017 1115   ALBUMIN 4.1 01/10/2017 1705     Studies/Results: No results found.  Anti-infectives: Anti-infectives    Start     Dose/Rate Route Frequency Ordered Stop   06/23/17 0651  vancomycin (VANCOCIN) 1-5 GM/200ML-% IVPB    Comments:  Key, Kristopher   : cabinet override      06/23/17 0651 06/23/17 0655   06/23/17 0532  vancomycin (VANCOCIN) IVPB 1000 mg/200 mL premix  Status:  Discontinued     1,000 mg 200 mL/hr over 60 Minutes Intravenous On call to O.R. 06/23/17 0533 06/23/17 0535   06/23/17 0532  vancomycin (VANCOCIN) IVPB 1000 mg/200 mL premix     1,000 mg 200 mL/hr over 60 Minutes Intravenous On call to O.R. 06/23/17 0532 06/23/17 0755       Bessye Stith J 06/24/2017

## 2017-06-24 NOTE — Progress Notes (Signed)
UGI shows no leak and passage of contrast into stomach.  Will start full liquid diet, no sodas or straws; sit upright for 60-90 minutes after eating.

## 2017-06-25 DIAGNOSIS — K220 Achalasia of cardia: Secondary | ICD-10-CM | POA: Diagnosis not present

## 2017-06-25 NOTE — Discharge Summary (Signed)
Physician Discharge Summary  Patient ID: Shelley Reese MRN: [id number] DOB/AGE: Jul 06, 1966 51 y.o.  Admit date: 06/23/2017 Discharge date: 06/25/2017  Admission Diagnoses:  achalasia  Discharge Diagnoses:  same  Principal Problem:   Achalasia s/p laparoscopic Heller myotomy an Dor fundoplication 06/23/17 Active Problems:   Acute deep vein thrombosis (DVT) of ulnar vein of left upper extremity [facility])   Surgery:  Lap Heller myotomy and Dor fundoplication  Discharged Condition: improved  Hospital Course:   Had surgery by Dr. Abbey Chatters.  Did well postop and begun on liquids.  Ready for discharge on Saturday.    Consults: none  Significant Diagnostic Studies: none    Discharge Exam: Blood pressure 138/85, pulse 73, temperature 98.8 F (37.1 C), temperature source Oral, resp. rate 16, height 5\' 9"  (1.753 m), weight 97.5 kg (215 lb), last menstrual period 05/17/2017, SpO2 96 %. Incisions OK;    Disposition: 01-Home or Self Care  Discharge Instructions    Call MD for:  persistant nausea and vomiting    Complete by:  As directed    Diet - low sodium heart healthy    Complete by:  As directed    Discharge instructions    Complete by:  As directed    Resume Xarelto tomorrow   Increase activity slowly    Complete by:  As directed      Allergies as of 06/25/2017      Reactions   Amoxicillin Other (See Comments)   GI Upset Has patient had a PCN reaction causing immediate rash, facial/tongue/throat swelling, SOB or lightheadedness with hypotension: No Has patient had a PCN reaction causing severe rash involving mucus membranes or skin necrosis: No Has patient had a PCN reaction that required hospitalization: No Has patient had a PCN reaction occurring within the last 10 years: Yes--GI upset occurred within 10 years. If all of the above answers are "NO", then may proceed with Cephalosporin use.      Medication List    STOP taking these medications   cefdinir 300 MG  capsule Commonly known as:  OMNICEF   FLUTTER Devi   traMADol 50 MG tablet Commonly known as:  ULTRAM     TAKE these medications   budesonide-formoterol 80-4.5 MCG/ACT inhaler Commonly known as:  SYMBICORT Inhale 2 puffs into the lungs 2 (two) times daily.   cetirizine 10 MG tablet Commonly known as:  ZYRTEC Take 10 mg by mouth daily as needed for allergies.   DEXILANT 60 MG capsule Generic drug:  dexlansoprazole Take 60 mg by mouth daily.   dextromethorphan-guaiFENesin 30-600 MG 12hr tablet Commonly known as:  MUCINEX DM Take 1 tablet by mouth 2 (two) times daily as needed for cough.   famotidine 20 MG tablet Commonly known as:  PEPCID One at bedtime What changed:  how much to take  how to take this  when to take this  reasons to take this  additional instructions   oxyCODONE 5 MG immediate release tablet Commonly known as:  Oxy IR/ROXICODONE Take 1-2 tablets (5-10 mg total) by mouth every 4 (four) hours as needed for moderate pain.   PARoxetine 10 MG tablet Commonly known as:  PAXIL Take 10 mg by mouth daily.   XARELTO 20 MG Tabs tablet Generic drug:  rivaroxaban Take 20 mg by mouth daily with breakfast. with food      Follow-up Information    Avel Peace, MD. Schedule an appointment as soon as possible for a visit in 3 week(s).   Specialty:  General  Surgery Contact information: [address] [place] [address] [phone]           Signed: Valarie MerinoMARTIN,Loden Laurent B 06/25/2017, 9:28 AM

## 2017-07-28 IMAGING — RF DG ESOPHAGUS
5 series · 14 of 22 positions shown · non-contrast
Comparison: None.

CLINICAL DATA: Achalasia

EXAM:
ESOPHOGRAM / BARIUM SWALLOW / BARIUM TABLET STUDY
TECHNIQUE: Combined double contrast and single contrast examination performed
using effervescent crystals, thick barium liquid, and thin barium
liquid. The patient was observed with fluoroscopy swallowing a 13 mm
barium sulphate tablet.
FLUOROSCOPY TIME:  Fluoroscopy Time:  3 minutes 18 seconds
Radiation Exposure Index (if provided by the fluoroscopic device):
Number of Acquired Spot Images: 0

[Series 1: sequence · 3 of 39 frames shown (1 of 3)]
[frame 6/39]
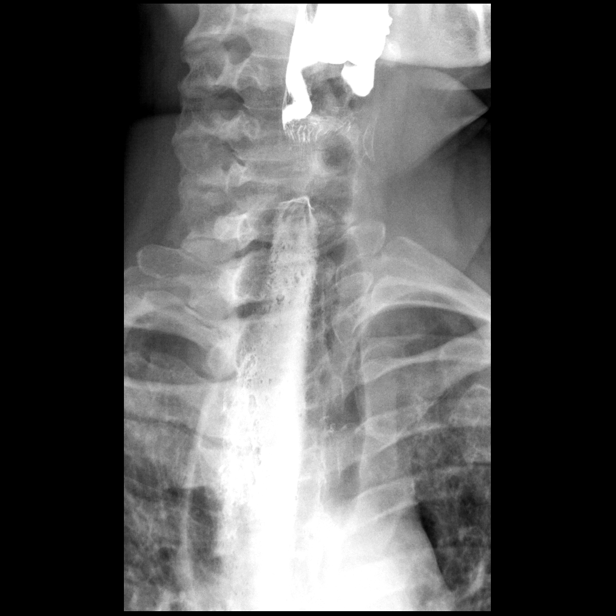
[frame 25/39]
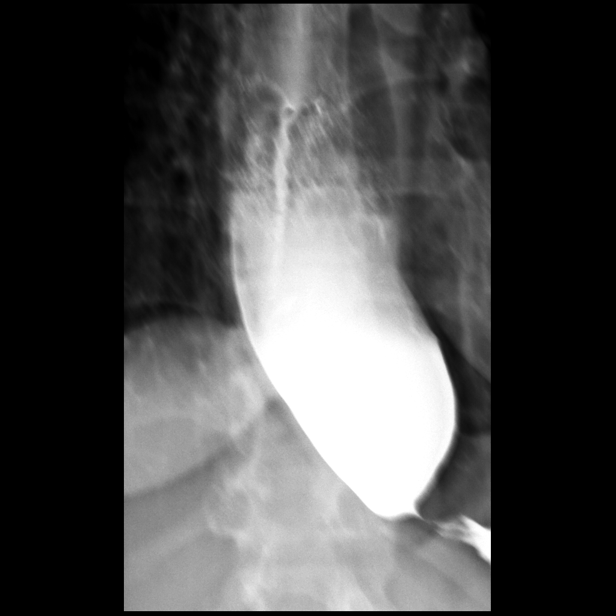
[frame 34/39]
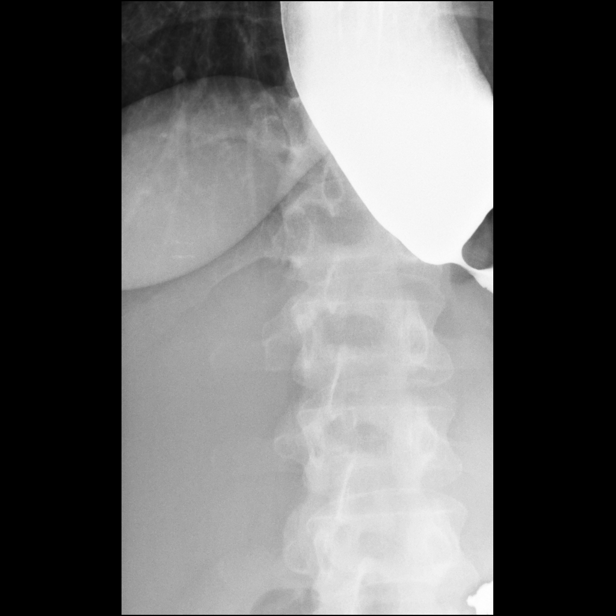

[Series 2: one shot · 5 of 9 slices shown (1 of 2)]
[im 2/9]
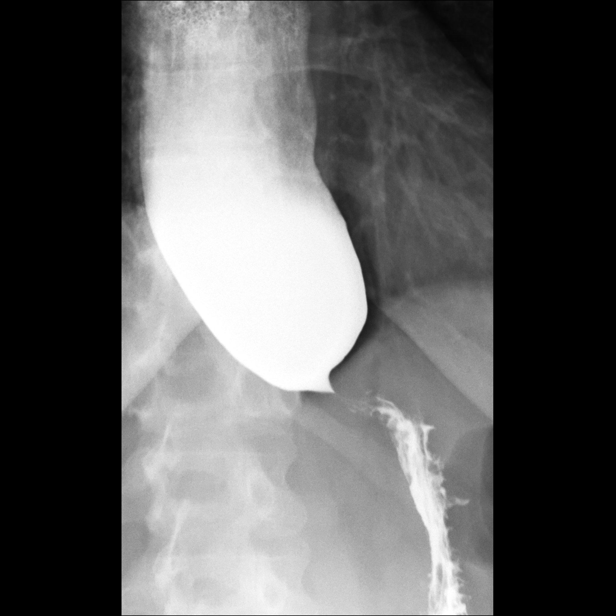
[im 4/9]
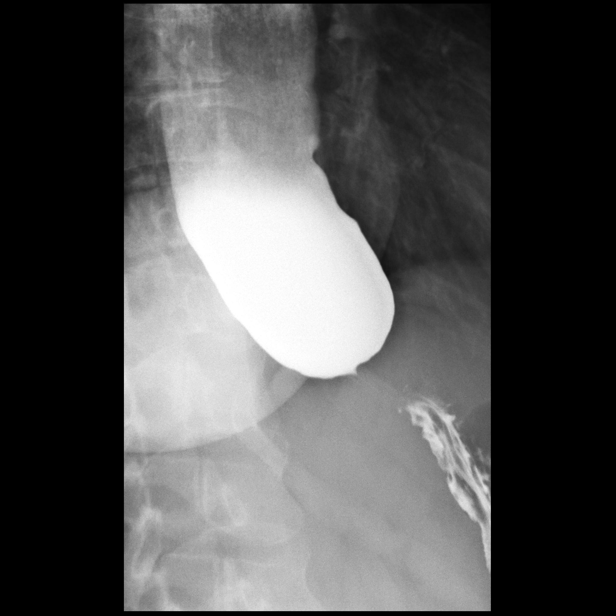
[im 5/9]
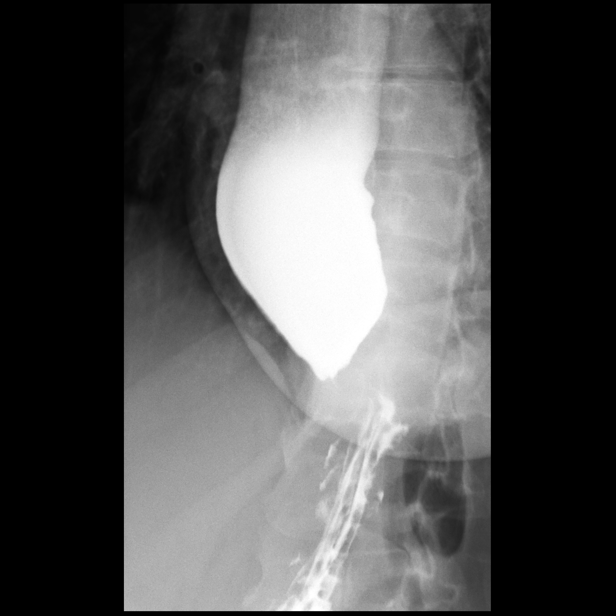
[im 7/9]
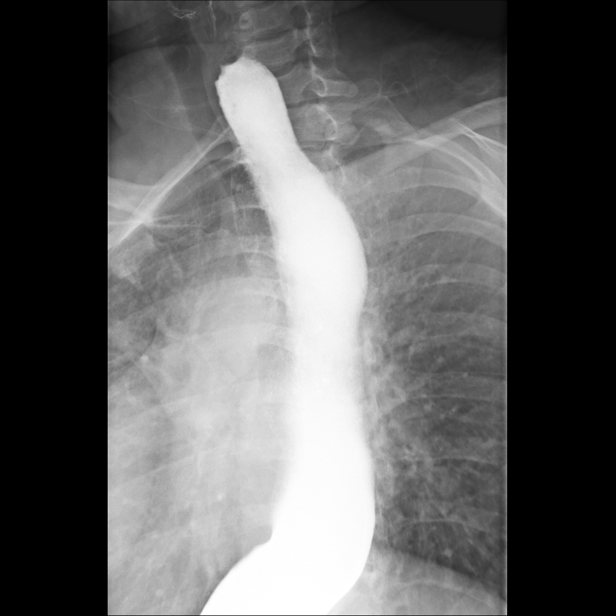
[im 8/9]
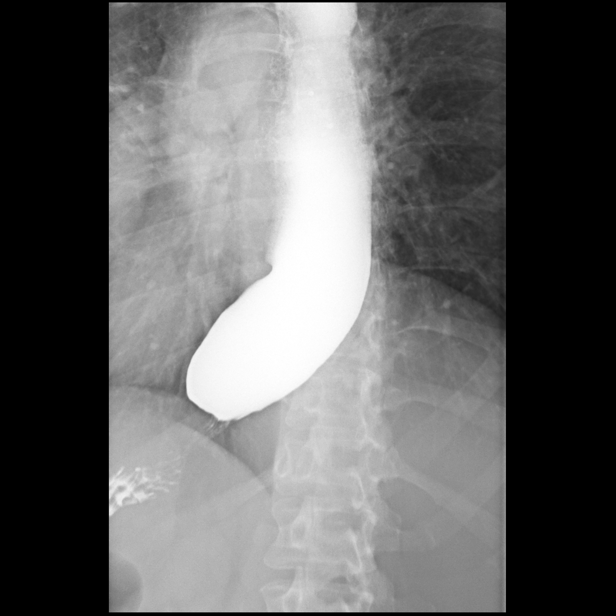

[Series 3: sequence · 3 of 10 frames shown (2 of 3)]
[frame 2/10]
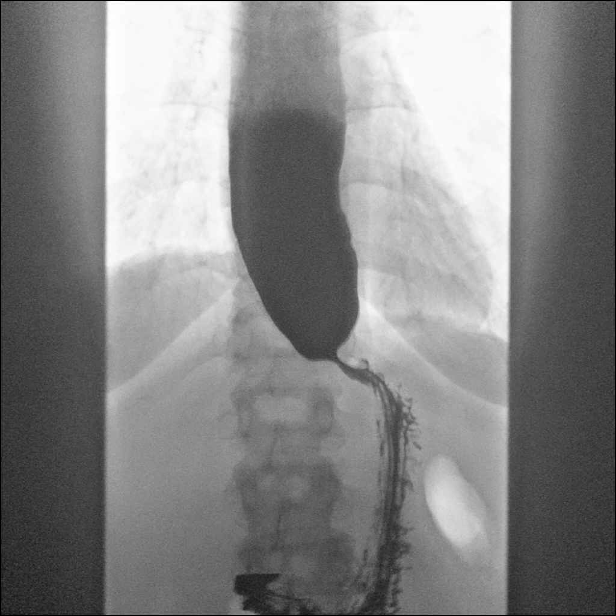
[frame 6/10]
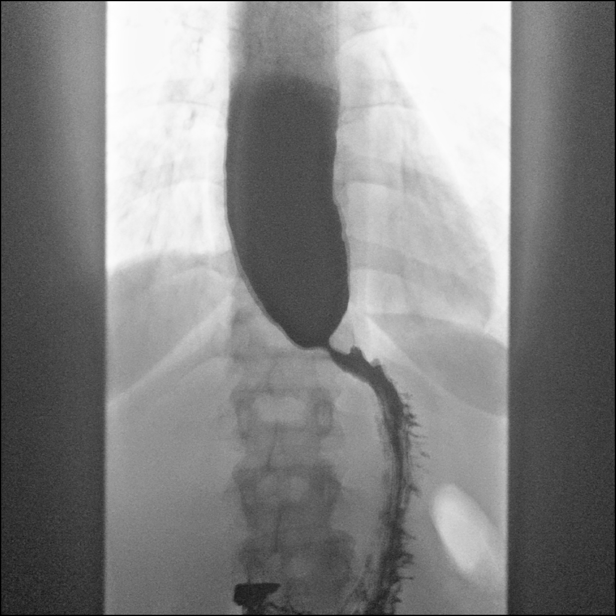
[frame 9/10]
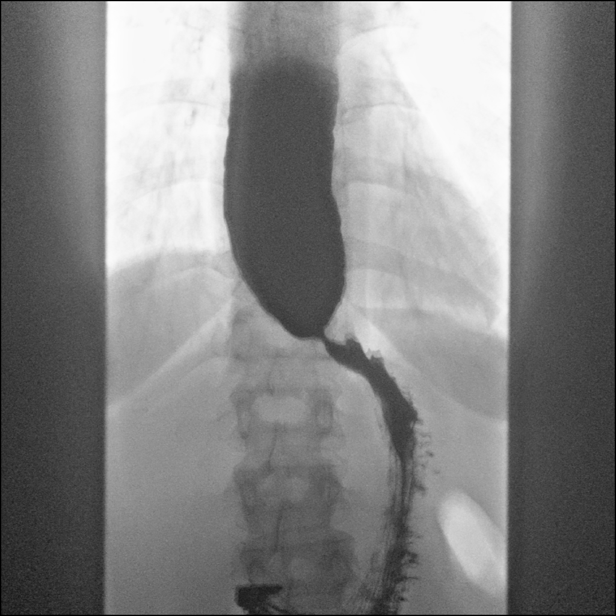

[Series 4: sequence · 2 of 11 frames shown (3 of 3)]
[frame 2/11]
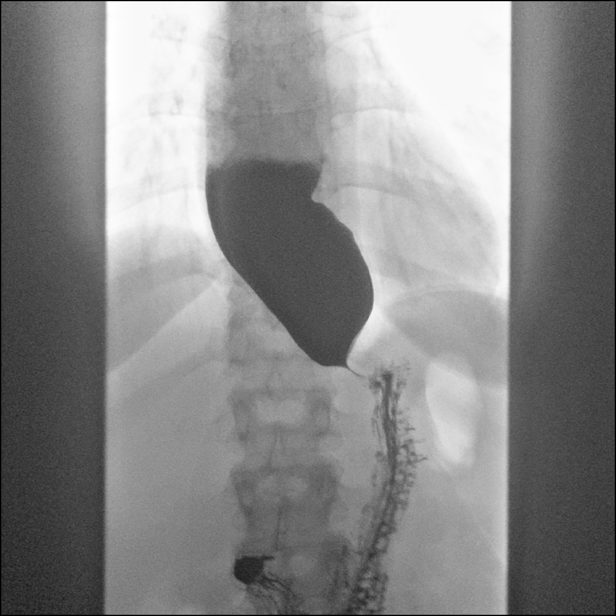
[frame 6/11]
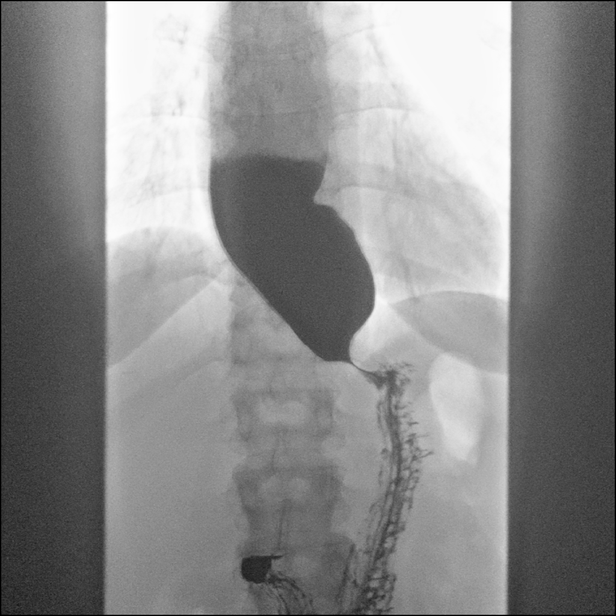

[Series 5: one shot · 1 of 1 slices shown (2 of 2)]
[im 1/1]
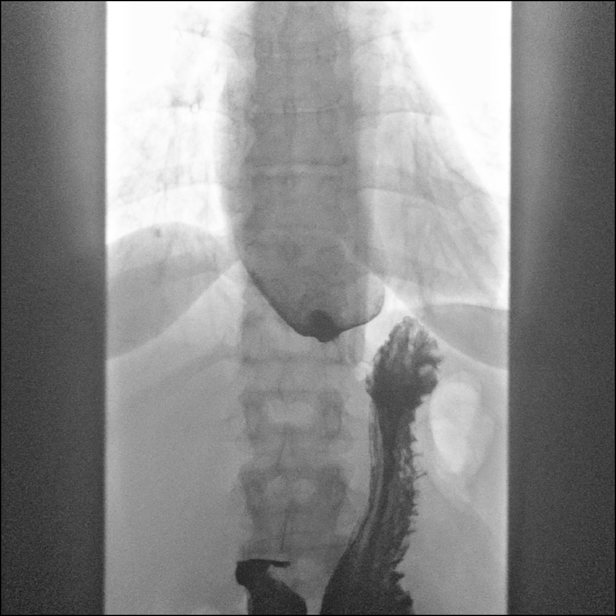

[14 of 22 positions shown; findings below may reference images not displayed]

FINDINGS: Marked dilatation of the esophagus. Smooth tapering of the distal
esophagus with severe narrowing which allowed only small amounts of
liquid to pass into the stomach. No mucosal irregularity. Negative
for ulcer or mass. Poor esophageal motility.

Barium tablet did not pass into the stomach due to the distal
esophageal stricture
IMPRESSION: Achalasia. Marked esophageal dilatation with poor esophageal
motility.

## 2017-10-12 ENCOUNTER — Other Ambulatory Visit: Payer: Self-pay | Admitting: Family Medicine

## 2017-10-12 DIAGNOSIS — M25569 Pain in unspecified knee: Secondary | ICD-10-CM

## 2017-10-13 ENCOUNTER — Ambulatory Visit
Admission: RE | Admit: 2017-10-13 | Discharge: 2017-10-13 | Disposition: A | Discharge status: Home / Self Care | Payer: BC Managed Care – PPO | Source: Ambulatory Visit | Attending: Family Medicine | Admitting: Family Medicine

## 2017-10-13 DIAGNOSIS — M25569 Pain in unspecified knee: Secondary | ICD-10-CM

## 2017-10-25 ENCOUNTER — Telehealth: Payer: Self-pay | Admitting: *Deleted

## 2017-10-25 NOTE — Telephone Encounter (Signed)
Request for surgical clearance:  1. What type of surgery is being performed? Lt Knee Arthroplasty  2. When is this surgery scheduled? TBD  3. Are there any medications that need to be held prior to surgery and how long? Xarelto  4. Name of physician performing surgery? Jodi GeraldsJohn Graves, MD  5. What is your office phone and fax number? Ph: [phone]  fax: [phone]  attn: Madison HickmanNicole Corbin (surgical coordinator)

## 2017-10-26 NOTE — Telephone Encounter (Signed)
Called pt re: Preop clearance. Pt has never been seen by our office and will require a new pt appt before we can clear her.  Left a message for her to call back.

## 2017-10-26 NOTE — Telephone Encounter (Signed)
Pt returned my call and she hasn't seen Dr. Allyson SabalBerry in years because she doesn't have any problems. Pt was confused why we received cardiac clearance since Dr. Cyndie ChimeGranfortuna manages her Xarelto. Pt was advised to contact her surgeon's office to find out and if she still needed clearance from us, she would have to call back and make a new pt appt.  Pt understood and thanked me for the call.

## 2017-10-26 NOTE — Telephone Encounter (Signed)
    Chart reviewed as part of pre-operative protocol coverage. In reviewing chart, it does not appear this patient has ever seen anyone in our office from what I can tell. Our office facilitated a remote stress test, but there is no prior visit with a provider at least back to Epic live date in 2012.  She will require a visit in order to better assess preoperative cardiovascular risk.   Pre-op covering staff: - Please schedule appointment and call patient to inform them. - Please contact requesting surgeon's office via preferred method (i.e, phone, fax) to inform them of need for appointment prior to surgery.  Laurann Montanaayna N Leanda Padmore, PA-C  10/26/2017, 1:39 PM

## 2017-11-01 ENCOUNTER — Encounter: Payer: Self-pay | Admitting: Oncology

## 2017-11-01 NOTE — Progress Notes (Unsigned)
[facility] s/P extensive unpovoked LUE DVT. Notified by [facility] 11/26 that pt having knee arthroscopy 11/28. I advised office to have her hold Xarelto for 24 hours pre-op. She has normal renal function as of 06/24/17. Can resume on evening of surgery if scheduled early in day, otherwise next morning. I left message on pt cell phone this evening to make sure she got these instructions and my pager # to call if any questions.

## 2018-03-06 ENCOUNTER — Encounter: Payer: BC Managed Care – PPO | Admitting: Oncology

## 2018-08-03 ENCOUNTER — Other Ambulatory Visit: Payer: Self-pay | Admitting: Orthopedic Surgery

## 2018-08-03 DIAGNOSIS — G8929 Other chronic pain: Secondary | ICD-10-CM

## 2018-08-03 DIAGNOSIS — M25561 Pain in right knee: Principal | ICD-10-CM

## 2018-08-05 ENCOUNTER — Ambulatory Visit
Admission: RE | Admit: 2018-08-05 | Discharge: 2018-08-05 | Disposition: A | Discharge status: Home / Self Care | Payer: BC Managed Care – PPO | Source: Ambulatory Visit | Attending: Orthopedic Surgery | Admitting: Orthopedic Surgery

## 2018-08-05 DIAGNOSIS — G8929 Other chronic pain: Secondary | ICD-10-CM

## 2018-08-05 DIAGNOSIS — M25561 Pain in right knee: Principal | ICD-10-CM

## 2018-10-05 ENCOUNTER — Other Ambulatory Visit: Payer: Self-pay | Admitting: Physician Assistant

## 2018-10-05 DIAGNOSIS — I82722 Chronic embolism and thrombosis of deep veins of left upper extremity: Secondary | ICD-10-CM

## 2018-11-10 ENCOUNTER — Other Ambulatory Visit: Payer: Self-pay | Admitting: Orthopedic Surgery

## 2018-11-22 ENCOUNTER — Other Ambulatory Visit (HOSPITAL_COMMUNITY): Payer: BC Managed Care – PPO

## 2018-11-24 ENCOUNTER — Encounter (HOSPITAL_COMMUNITY): Payer: Self-pay

## 2018-11-24 ENCOUNTER — Ambulatory Visit (HOSPITAL_COMMUNITY): Admit: 2018-11-24 | Payer: BC Managed Care – PPO | Admitting: Orthopedic Surgery

## 2018-11-24 SURGERY — ARTHROSCOPY, KNEE
Anesthesia: General | Laterality: Right

## 2019-04-05 ENCOUNTER — Other Ambulatory Visit: Payer: Self-pay | Admitting: Obstetrics and Gynecology

## 2019-04-05 DIAGNOSIS — Z1231 Encounter for screening mammogram for malignant neoplasm of breast: Secondary | ICD-10-CM

## 2020-02-02 ENCOUNTER — Ambulatory Visit: Payer: BC Managed Care – PPO | Attending: Internal Medicine

## 2020-02-02 DIAGNOSIS — Z23 Encounter for immunization: Secondary | ICD-10-CM | POA: Insufficient documentation

## 2020-02-02 NOTE — Progress Notes (Signed)
   Covid-19 Vaccination Clinic  Name:  Shelley Reese    MRN: [id number] DOB: 15-Oct-1966  02/02/2020  [name] was observed post Covid-19 immunization for 15 minutes without incidence. She was provided with Vaccine Information Sheet and instruction to access the V-Safe system.   [name] was instructed to call 911 with any severe reactions post vaccine: Marland Kitchen Difficulty breathing  . Swelling of your face and throat  . A fast heartbeat  . A bad rash all over your body  . Dizziness and weakness    Immunizations Administered    Name Date Dose VIS Date Route   Pfizer COVID-19 Vaccine 02/02/2020 12:36 PM 0.3 mL 11/16/2019 Intramuscular   Manufacturer: [employer], [employer]   Lot: [id number]   NDC: [address]-8

## 2020-02-23 ENCOUNTER — Ambulatory Visit: Payer: BC Managed Care – PPO | Attending: Internal Medicine

## 2020-02-23 DIAGNOSIS — Z23 Encounter for immunization: Secondary | ICD-10-CM

## 2020-02-23 NOTE — Progress Notes (Signed)
   Covid-19 Vaccination Clinic  Name:  Shelley Reese    MRN: [id number] DOB: 02/19/1966  02/23/2020  Shelley Reese was observed post Covid-19 immunization for 15 minutes without incident. She was provided with Vaccine Information Sheet and instruction to access the V-Safe system.   Shelley Reese was instructed to call 911 with any severe reactions post vaccine: Marland Kitchen Difficulty breathing  . Swelling of face and throat  . A fast heartbeat  . A bad rash all over body  . Dizziness and weakness   Immunizations Administered    Name Date Dose VIS Date Route   Pfizer COVID-19 Vaccine 02/23/2020 10:57 AM 0.3 mL 11/16/2019 Intramuscular   Manufacturer: [employer], [employer]   Lot: [id number]   NDC: [address]-4

## 2020-02-27 ENCOUNTER — Ambulatory Visit: Payer: BC Managed Care – PPO

## 2020-05-02 ENCOUNTER — Other Ambulatory Visit: Payer: Self-pay | Admitting: Gastroenterology

## 2020-05-02 ENCOUNTER — Other Ambulatory Visit (HOSPITAL_COMMUNITY): Payer: Self-pay | Admitting: Gastroenterology

## 2020-05-02 DIAGNOSIS — R1011 Right upper quadrant pain: Secondary | ICD-10-CM

## 2020-05-12 ENCOUNTER — Other Ambulatory Visit: Payer: Self-pay | Admitting: Gastroenterology

## 2020-05-12 DIAGNOSIS — R1319 Other dysphagia: Secondary | ICD-10-CM

## 2020-05-23 ENCOUNTER — Encounter (HOSPITAL_COMMUNITY): Payer: BC Managed Care – PPO

## 2020-06-11 ENCOUNTER — Ambulatory Visit (HOSPITAL_COMMUNITY): Payer: BC Managed Care – PPO

## 2020-06-11 ENCOUNTER — Encounter (HOSPITAL_COMMUNITY): Payer: Self-pay

## 2020-07-04 ENCOUNTER — Ambulatory Visit
Admission: RE | Admit: 2020-07-04 | Discharge: 2020-07-04 | Disposition: A | Discharge status: Home / Self Care | Payer: BC Managed Care – PPO | Source: Ambulatory Visit | Attending: Gastroenterology | Admitting: Gastroenterology

## 2020-07-04 DIAGNOSIS — R1319 Other dysphagia: Secondary | ICD-10-CM

## 2021-02-12 ENCOUNTER — Emergency Department (HOSPITAL_COMMUNITY)
Admission: EM | Admit: 2021-02-12 | Discharge: 2021-02-13 | Disposition: A | Discharge status: Home / Self Care | Payer: BC Managed Care – PPO | Attending: Emergency Medicine | Admitting: Emergency Medicine

## 2021-02-12 ENCOUNTER — Other Ambulatory Visit: Payer: Self-pay

## 2021-02-12 ENCOUNTER — Emergency Department (HOSPITAL_COMMUNITY): Payer: BC Managed Care – PPO

## 2021-02-12 DIAGNOSIS — R0789 Other chest pain: Secondary | ICD-10-CM | POA: Insufficient documentation

## 2021-02-12 DIAGNOSIS — K219 Gastro-esophageal reflux disease without esophagitis: Secondary | ICD-10-CM | POA: Diagnosis not present

## 2021-02-12 DIAGNOSIS — Z7901 Long term (current) use of anticoagulants: Secondary | ICD-10-CM | POA: Insufficient documentation

## 2021-02-12 DIAGNOSIS — Z87891 Personal history of nicotine dependence: Secondary | ICD-10-CM | POA: Diagnosis not present

## 2021-02-12 DIAGNOSIS — R079 Chest pain, unspecified: Secondary | ICD-10-CM

## 2021-02-12 DIAGNOSIS — J45909 Unspecified asthma, uncomplicated: Secondary | ICD-10-CM | POA: Diagnosis not present

## 2021-02-12 DIAGNOSIS — R6884 Jaw pain: Secondary | ICD-10-CM | POA: Diagnosis not present

## 2021-02-12 LAB — CBC WITH DIFFERENTIAL/PLATELET
Abs Immature Granulocytes: 0.39 10*3/uL — ABNORMAL HIGH (ref 0.00–0.07)
Basophils Absolute: 0.1 10*3/uL (ref 0.0–0.1)
Basophils Relative: 1 %
Eosinophils Absolute: 0 10*3/uL (ref 0.0–0.5)
Eosinophils Relative: 0 %
HCT: 48.5 % — ABNORMAL HIGH (ref 36.0–46.0)
Hemoglobin: 15.4 g/dL — ABNORMAL HIGH (ref 12.0–15.0)
Immature Granulocytes: 3 %
Lymphocytes Relative: 11 %
Lymphs Abs: 1.8 10*3/uL (ref 0.7–4.0)
MCH: 29.8 pg (ref 26.0–34.0)
MCHC: 31.8 g/dL (ref 30.0–36.0)
MCV: 94 fL (ref 80.0–100.0)
Monocytes Absolute: 1 10*3/uL (ref 0.1–1.0)
Monocytes Relative: 6 %
Neutro Abs: 12.5 10*3/uL — ABNORMAL HIGH (ref 1.7–7.7)
Neutrophils Relative %: 79 %
Platelets: 391 10*3/uL (ref 150–400)
RBC: 5.16 MIL/uL — ABNORMAL HIGH (ref 3.87–5.11)
RDW: 13.5 % (ref 11.5–15.5)
WBC: 15.8 10*3/uL — ABNORMAL HIGH (ref 4.0–10.5)
nRBC: 0 % (ref 0.0–0.2)

## 2021-02-12 LAB — COMPREHENSIVE METABOLIC PANEL
ALT: 22 U/L (ref 0–44)
AST: 17 U/L (ref 15–41)
Albumin: 4.3 g/dL (ref 3.5–5.0)
Alkaline Phosphatase: 60 U/L (ref 38–126)
Anion gap: 11 (ref 5–15)
BUN: 14 mg/dL (ref 6–20)
CO2: 24 mmol/L (ref 22–32)
Calcium: 9.7 mg/dL (ref 8.9–10.3)
Chloride: 101 mmol/L (ref 98–111)
Creatinine, Ser: 0.64 mg/dL (ref 0.44–1.00)
GFR, Estimated: >60 mL/min (ref >60)
Glucose, Bld: 96 mg/dL (ref 70–99)
Potassium: 3.7 mmol/L (ref 3.5–5.1)
Sodium: 136 mmol/L (ref 135–145)
Total Bilirubin: 0.9 mg/dL (ref 0.3–1.2)
Total Protein: 7.4 g/dL (ref 6.5–8.1)

## 2021-02-12 LAB — TROPONIN I (HIGH SENSITIVITY): Troponin I (High Sensitivity): 5 ng/L (ref <18)

## 2021-02-12 NOTE — ED Triage Notes (Signed)
Pt bib EMS from for intermittent chest pain that started around 1730 today. Chest pain described as dull pressure that radiates to jaw. Pt took 324 ASA before EMS arrival which resolved. Another chest pain episode occurred with EMS that resolved as well. High BP noted.  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   Possible lupus flare up recently,  prednisone dose upped to 60mg  and chemo therapy started within last week   No N/V, very anxious  Vitals: BP: 207/133 HR: 84 O2: 100% CBG: 155

## 2021-02-12 NOTE — ED Notes (Signed)
Patient transported to X-ray 

## 2021-02-12 NOTE — Discharge Instructions (Addendum)
Please keep a daily log of your blood pressure, bring this to your next PCP visit.  Continue taking your omeprazole. Follow closely with your primary care provider.   Return to the ER for new or worsening symptoms; including worsening chest pain, shortness of breath, pain that radiates to the arm or neck, pain or shortness of breath worsened with exertion.

## 2021-02-12 NOTE — ED Provider Notes (Signed)
[facility] EMERGENCY DEPARTMENT Provider Note   CSN: [id number] Arrival date & time: 02/12/21  2016     History Chief Complaint  Patient presents with  . Chest Pain    Shelley Reese is a 55 y.o. female past medical history of lupus, DVT on Xarelto, presenting to the emergency department with complaint of chest pain.  Patient states she was sitting at home when she began having a dull pressure-like sensation in her chest that radiated up to her bilateral jaw.  This started around 1830. She states she called her neighbor who is a Engineer, civil (consulting) and checked her blood pressure and it was noted to be high. She has been having higher blood pressures more frequently, previously no hx HTN.  Her pain went away before EMS arrived. She took 325 mg of aspirin.  She states when EMS was there her pain returned on the right side of her chest and felt like GERD. However since it returned, she thought it best to be evaluated in the ED.  She initially thought it was due to her GERD, has been out of her omeprazole.    Has not missed any doses of her xarelto.  Denies history of CAD, no first-degree relatives with CAD before the age of 20, denies history of hypertension, diabetes, hyperlipidemia.  Remote history of smoking.  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  The history is provided by the patient.       Past Medical History:  Diagnosis Date  . Acute deep vein thrombosis (DVT) of ulnar vein of left upper extremity (HCC) 01/10/2017   11/04/16 all veins from basilic/cephalic up through internal jugular; unprovoked  . Asthma   . Complication of anesthesia    following colonoscopy; spiked BP but was likely related to size of cuff; " one they gave me a bigger cuff it went right down "  . GERD (gastroesophageal reflux disease)   . Hoarseness of voice 01/10/2017   X1 year; reported at visit 01/10/17; nodules on voacal chords     Patient Active Problem List    Diagnosis Date Noted  . Achalasia s/p laparoscopic Heller myotomy an Dor fundoplication 06/23/17 06/23/2017  . Cough 03/09/2017  . Pulmonary infiltrates 02/13/2017  . Acute deep vein thrombosis (DVT) of ulnar vein of left upper extremity (HCC) 01/10/2017  . Hoarseness of voice 01/10/2017  . Cough variant asthma vs UACS 11/27/2011    Past Surgical History:  Procedure Laterality Date  . ESOPHAGEAL MANOMETRY N/A 03/09/2017   Procedure: ESOPHAGEAL MANOMETRY (EM);  Surgeon: Charna Elizabeth, MD;  Location: [facility] ENDOSCOPY;  Service: Endoscopy;  Laterality: N/A;  . HELLER MYOTOMY N/A 06/23/2017   Procedure: LAPAROSCOPIC HELLER MYOTOMY AND DOR FUNDOPLICATION;  Surgeon: Avel Peace, MD;  Location: [facility] ORS;  Service: General;  Laterality: N/A;  . VAGINAL DELIVERY       OB History   No obstetric history on file.     Family History  Problem Relation Age of Onset  . Heart disease Father   . Heart disease Mother   . Deep vein thrombosis Mother   . Lung cancer Mother        used to smoke, exposure to asbestos  . Mitral valve prolapse Mother   . Lupus Mother   . Allergies Brother   . Allergies Sister   . Colon cancer Paternal Grandmother   . Colon cancer Maternal Grandmother   . Liver cancer Maternal Grandmother   . Bone cancer  Maternal Aunt   . Breast cancer Paternal Aunt     Social History   Tobacco Use  . Smoking status: Former Smoker    Types: Cigarettes    Quit date: 12/06/2002    Years since quitting: 18.2  . Smokeless tobacco: Never Used  . Tobacco comment: Stopped x 14 yrs.  Vaping Use  . Vaping Use: Never used  Substance Use Topics  . Alcohol use: Yes    Comment: very rare  . Drug use: No    Home Medications Prior to Admission medications   Medication Sig Start Date End Date Taking? Authorizing Provider  budesonide-formoterol (SYMBICORT) 80-4.5 MCG/ACT inhaler Inhale 2 puffs into the lungs 2 (two) times daily. 05/23/17   Nyoka Cowden, MD  cetirizine (ZYRTEC) 10 MG  tablet Take 10 mg by mouth daily as needed for allergies.     [provider]  dexlansoprazole (DEXILANT) 60 MG capsule Take 60 mg by mouth daily.    [provider]  dextromethorphan-guaiFENesin (MUCINEX DM) 30-600 MG 12hr tablet Take 1 tablet by mouth 2 (two) times daily as needed for cough.    [provider]  famotidine (PEPCID) 20 MG tablet One at bedtime Patient taking differently: Take 20 mg by mouth daily as needed for heartburn.  02/11/17   Nyoka Cowden, MD  oxyCODONE (OXY IR/ROXICODONE) 5 MG immediate release tablet Take 1-2 tablets (5-10 mg total) by mouth every 4 (four) hours as needed for moderate pain. 06/24/17   Avel Peace, MD  PARoxetine (PAXIL) 10 MG tablet Take 10 mg by mouth daily.    [provider]  XARELTO 20 MG TABS tablet Take 20 mg by mouth daily with breakfast. with food 06/02/17   [provider]    Allergies    Amoxicillin  Review of Systems   Review of Systems  All other systems reviewed and are negative.   Physical Exam Updated Vital Signs BP (!) 153/89   Pulse 71   Temp 97.6 F (36.4 C) (Oral)   Resp 18   SpO2 98%   Physical Exam Vitals and nursing note reviewed.  Constitutional:      General: She is not in acute distress.    Appearance: She is well-developed.  HENT:     Head: Normocephalic and atraumatic.  Eyes:     Conjunctiva/sclera: Conjunctivae normal.  Cardiovascular:     Rate and Rhythm: Normal rate and regular rhythm.  Pulmonary:     Effort: Pulmonary effort is normal. No respiratory distress.     Breath sounds: Normal breath sounds.  Abdominal:     General: Bowel sounds are normal.     Palpations: Abdomen is soft.     Tenderness: There is no abdominal tenderness.  Musculoskeletal:     Comments: Trace pretibial edema bilaterally  Skin:    General: Skin is warm.  Neurological:     Mental Status: She is alert.  Psychiatric:        Behavior: Behavior normal.     ED Results /  Procedures / Treatments   Labs (all labs ordered are listed, but only abnormal results are displayed) Labs Reviewed  CBC WITH DIFFERENTIAL/PLATELET - Abnormal; Notable for the following components:      Result Value   WBC 15.8 (*)    RBC 5.16 (*)    Hemoglobin 15.4 (*)    HCT 48.5 (*)    Neutro Abs 12.5 (*)    Abs Immature Granulocytes 0.39 (*)    All  other components within normal limits  COMPREHENSIVE METABOLIC PANEL  TROPONIN I (HIGH SENSITIVITY)  TROPONIN I (HIGH SENSITIVITY)    EKG EKG Interpretation  Date/Time:  Thursday February 12 2021 20:22:41 EST Ventricular Rate:  84 PR Interval:    QRS Duration: 86 QT Interval:  368 QTC Calculation: 435 R Axis:   24 Text Interpretation: Sinus rhythm No significant change since last tracing Confirmed by Lorre Nick ([id number]) on 02/12/2021 10:13:19 PM   Radiology DG Chest 2 View  Result Date: 02/12/2021 CLINICAL DATA:  Intermittent chest pain. EXAM: CHEST - 2 VIEW COMPARISON:  Radiograph 09/30/2016 FINDINGS: The cardiomediastinal contours are normal. The lungs are clear. Pulmonary vasculature is normal. No consolidation, pleural effusion, or pneumothorax. No acute osseous abnormalities are seen. IMPRESSION: No acute chest findings. Electronically Signed   By: Narda Rutherford M.D.   On: 02/12/2021 21:21    Procedures Procedures   Medications Ordered in ED Medications - No data to display  ED Course  I have reviewed the triage vital signs and the nursing notes.  Pertinent labs & imaging results that were available during my care of the patient were reviewed by me and considered in my medical decision making (see chart for details).  Clinical Course as of 02/12/21 2348  Thu Feb 12, 2021  2318 WBC(!): 15.8 Patient taking prednisone chronically, likely attributing to leukocytosis [JR]  2323 Troponin I (High Sensitivity): 5 WNL [JR]  2323 Comprehensive metabolic panel WNL [JR]  2332 4332 [JR]  2336 Cytoclomazepide? [JR]     Clinical Course User Index [JR] Shandon Burlingame, [place] N, PA-C   MDM Rules/Calculators/A&P                          Patient is a 55 year old female with history of lupus, chronically on prednisone, presenting to the emergency department for chest pain that began today while at rest.  Pain initially felt like her typical GERD symptoms, she has been out of her omeprazole.  However pain began radiating to her jaw and occurred twice therefore she presented for evaluation.  Has been asymptomatic upon evaluation in the ED and throughout ED stay.  She is noted to be hypertensive, she states this is rather new for her, being followed by her PCP.  No history of CAD, hyperlipidemia, diabetes, family history.  Remote history of tobacco use.  Low suspicion for PE, patient is compliant on xarelto, symptoms are not typical.  Heart and lung sounds are normal.  ECG is nonischemic.  Chest x-ray is clear. Initial trop is negative. Leukocytosis is present though this is likely due to chronic prednisone.   HEAR score 3.   Care assumed at shift change by Shelley Sites, PA-C. Plan to re-check troponin. If negative, patient is appropriate for discharge to home with f/u with her PCP. Recommend BP log, restart omeprazole, return if worsening.  Final Clinical Impression(s) / ED Diagnoses Final diagnoses:  Central chest pain    Rx / DC Orders ED Discharge Orders    None       Tykeisha Peer, [place] N, PA-C 02/13/21 0009    Lorre Nick, MD 02/16/21 [phone]

## 2021-02-13 LAB — TROPONIN I (HIGH SENSITIVITY): Troponin I (High Sensitivity): 6 ng/L (ref <18)

## 2021-07-01 ENCOUNTER — Inpatient Hospital Stay (HOSPITAL_COMMUNITY): Payer: BC Managed Care – PPO

## 2021-07-01 ENCOUNTER — Inpatient Hospital Stay (HOSPITAL_BASED_OUTPATIENT_CLINIC_OR_DEPARTMENT_OTHER)
Admission: EM | Admit: 2021-07-01 | Discharge: 2021-07-04 | DRG: 072 | Disposition: A | Discharge status: Other Acute Inpatient Hospital | Payer: BC Managed Care – PPO | Source: Ambulatory Visit | Attending: Internal Medicine | Admitting: Internal Medicine

## 2021-07-01 ENCOUNTER — Other Ambulatory Visit: Payer: Self-pay

## 2021-07-01 ENCOUNTER — Emergency Department (HOSPITAL_BASED_OUTPATIENT_CLINIC_OR_DEPARTMENT_OTHER): Payer: BC Managed Care – PPO

## 2021-07-01 ENCOUNTER — Encounter (HOSPITAL_BASED_OUTPATIENT_CLINIC_OR_DEPARTMENT_OTHER): Payer: Self-pay

## 2021-07-01 DIAGNOSIS — H543 Unqualified visual loss, both eyes: Secondary | ICD-10-CM | POA: Diagnosis present

## 2021-07-01 DIAGNOSIS — Z880 Allergy status to penicillin: Secondary | ICD-10-CM | POA: Diagnosis not present

## 2021-07-01 DIAGNOSIS — Z803 Family history of malignant neoplasm of breast: Secondary | ICD-10-CM

## 2021-07-01 DIAGNOSIS — H35063 Retinal vasculitis, bilateral: Secondary | ICD-10-CM | POA: Diagnosis present

## 2021-07-01 DIAGNOSIS — Z20822 Contact with and (suspected) exposure to covid-19: Secondary | ICD-10-CM | POA: Diagnosis present

## 2021-07-01 DIAGNOSIS — Z7901 Long term (current) use of anticoagulants: Secondary | ICD-10-CM

## 2021-07-01 DIAGNOSIS — F40240 Claustrophobia: Secondary | ICD-10-CM | POA: Diagnosis present

## 2021-07-01 DIAGNOSIS — Z8249 Family history of ischemic heart disease and other diseases of the circulatory system: Secondary | ICD-10-CM

## 2021-07-01 DIAGNOSIS — K219 Gastro-esophageal reflux disease without esophagitis: Secondary | ICD-10-CM | POA: Diagnosis present

## 2021-07-01 DIAGNOSIS — Z86718 Personal history of other venous thrombosis and embolism: Secondary | ICD-10-CM | POA: Diagnosis not present

## 2021-07-01 DIAGNOSIS — R112 Nausea with vomiting, unspecified: Secondary | ICD-10-CM

## 2021-07-01 DIAGNOSIS — I6783 Posterior reversible encephalopathy syndrome: Secondary | ICD-10-CM | POA: Diagnosis present

## 2021-07-01 DIAGNOSIS — H542X22 Low vision right eye category 2, low vision left eye category 2: Secondary | ICD-10-CM | POA: Diagnosis present

## 2021-07-01 DIAGNOSIS — Z807 Family history of other malignant neoplasms of lymphoid, hematopoietic and related tissues: Secondary | ICD-10-CM

## 2021-07-01 DIAGNOSIS — E6609 Other obesity due to excess calories: Secondary | ICD-10-CM

## 2021-07-01 DIAGNOSIS — Z801 Family history of malignant neoplasm of trachea, bronchus and lung: Secondary | ICD-10-CM | POA: Diagnosis not present

## 2021-07-01 DIAGNOSIS — Z87891 Personal history of nicotine dependence: Secondary | ICD-10-CM | POA: Diagnosis not present

## 2021-07-01 DIAGNOSIS — Z832 Family history of diseases of the blood and blood-forming organs and certain disorders involving the immune mechanism: Secondary | ICD-10-CM | POA: Diagnosis not present

## 2021-07-01 DIAGNOSIS — Z800 Family history of malignant neoplasm of digestive organs: Secondary | ICD-10-CM | POA: Diagnosis not present

## 2021-07-01 DIAGNOSIS — E66811 Obesity, class 1: Secondary | ICD-10-CM

## 2021-07-01 DIAGNOSIS — M329 Systemic lupus erythematosus, unspecified: Secondary | ICD-10-CM | POA: Diagnosis present

## 2021-07-01 DIAGNOSIS — Z6834 Body mass index (BMI) 34.0-34.9, adult: Secondary | ICD-10-CM

## 2021-07-01 DIAGNOSIS — I10 Essential (primary) hypertension: Secondary | ICD-10-CM | POA: Diagnosis present

## 2021-07-01 HISTORY — DX: Systemic lupus erythematosus, unspecified: M32.9

## 2021-07-01 HISTORY — DX: Reserved for concepts with insufficient information to code with codable children: IMO0002

## 2021-07-01 HISTORY — DX: Unspecified visual loss: H54.7

## 2021-07-01 LAB — COMPREHENSIVE METABOLIC PANEL
ALT: 20 U/L (ref 0–44)
AST: 15 U/L (ref 15–41)
Albumin: 4.4 g/dL (ref 3.5–5.0)
Alkaline Phosphatase: 47 U/L (ref 38–126)
Anion gap: 13 (ref 5–15)
BUN: 10 mg/dL (ref 6–20)
CO2: 22 mmol/L (ref 22–32)
Calcium: 9.8 mg/dL (ref 8.9–10.3)
Chloride: 103 mmol/L (ref 98–111)
Creatinine, Ser: 0.76 mg/dL (ref 0.44–1.00)
GFR, Estimated: >60 mL/min (ref >60)
Glucose, Bld: 148 mg/dL — ABNORMAL HIGH (ref 70–99)
Potassium: 3.6 mmol/L (ref 3.5–5.1)
Sodium: 138 mmol/L (ref 135–145)
Total Bilirubin: 1.3 mg/dL — ABNORMAL HIGH (ref 0.3–1.2)
Total Protein: 7 g/dL (ref 6.5–8.1)

## 2021-07-01 LAB — CBC WITH DIFFERENTIAL/PLATELET
Abs Immature Granulocytes: 0.04 10*3/uL (ref 0.00–0.07)
Basophils Absolute: 0 10*3/uL (ref 0.0–0.1)
Basophils Relative: 1 %
Eosinophils Absolute: 0 10*3/uL (ref 0.0–0.5)
Eosinophils Relative: 1 %
HCT: 41.3 % (ref 36.0–46.0)
Hemoglobin: 14 g/dL (ref 12.0–15.0)
Immature Granulocytes: 1 %
Lymphocytes Relative: 19 %
Lymphs Abs: 1.3 10*3/uL (ref 0.7–4.0)
MCH: 30.9 pg (ref 26.0–34.0)
MCHC: 33.9 g/dL (ref 30.0–36.0)
MCV: 91.2 fL (ref 80.0–100.0)
Monocytes Absolute: 0.7 10*3/uL (ref 0.1–1.0)
Monocytes Relative: 11 %
Neutro Abs: 4.5 10*3/uL (ref 1.7–7.7)
Neutrophils Relative %: 67 %
Platelets: 371 10*3/uL (ref 150–400)
RBC: 4.53 MIL/uL (ref 3.87–5.11)
RDW: 13.5 % (ref 11.5–15.5)
WBC: 6.6 10*3/uL (ref 4.0–10.5)
nRBC: 0 % (ref 0.0–0.2)

## 2021-07-01 LAB — RESP PANEL BY RT-PCR (FLU A&B, COVID) ARPGX2
Influenza A by PCR: NEGATIVE
Influenza B by PCR: NEGATIVE
SARS Coronavirus 2 by RT PCR: NEGATIVE

## 2021-07-01 MED ORDER — LABETALOL HCL 5 MG/ML IV SOLN
20.0000 mg | Freq: Once | INTRAVENOUS | Status: AC
  Administered 2021-07-01: 20 mg via INTRAVENOUS
  Filled 2021-07-01: qty 4

## 2021-07-01 MED ORDER — GADOBUTROL 1 MMOL/ML IV SOLN
10.0000 mL | Freq: Once | INTRAVENOUS | Status: AC | PRN
  Administered 2021-07-01: 10 mL via INTRAVENOUS

## 2021-07-01 MED ORDER — SODIUM CHLORIDE 0.9 % IV SOLN
INTRAVENOUS | Status: DC
  Administered 2021-07-01: 50 mL/h via INTRAVENOUS

## 2021-07-01 MED ORDER — DORZOLAMIDE HCL-TIMOLOL MAL 2-0.5 % OP SOLN
1.0000 [drp] | Freq: Two times a day (BID) | OPHTHALMIC | Status: DC
  Administered 2021-07-01 – 2021-07-03 (×6): 1 [drp] via OPHTHALMIC
  Filled 2021-07-01: qty 10

## 2021-07-01 MED ORDER — ACETAMINOPHEN 160 MG/5ML PO SOLN: 650.0000 mg | ORAL | Status: DC | PRN

## 2021-07-01 MED ORDER — LACTATED RINGERS IV BOLUS
1000.0000 mL | Freq: Once | INTRAVENOUS | Status: AC
  Administered 2021-07-01: 1000 mL via INTRAVENOUS

## 2021-07-01 MED ORDER — PROCHLORPERAZINE EDISYLATE 10 MG/2ML IJ SOLN
10.0000 mg | Freq: Once | INTRAMUSCULAR | Status: AC
  Administered 2021-07-01: 10 mg via INTRAVENOUS
  Filled 2021-07-01: qty 2

## 2021-07-01 MED ORDER — LORAZEPAM 1 MG PO TABS
4.0000 mg | ORAL_TABLET | Freq: Once | ORAL | Status: AC
  Administered 2021-07-01: 4 mg via ORAL
  Filled 2021-07-01: qty 4

## 2021-07-01 MED ORDER — HYDRALAZINE HCL 20 MG/ML IJ SOLN: 10.0000 mg | INTRAMUSCULAR | Status: DC | PRN

## 2021-07-01 MED ORDER — PREDNISOLONE ACETATE 1 % OP SUSP
1.0000 [drp] | OPHTHALMIC | Status: DC
  Administered 2021-07-01 – 2021-07-03 (×11): 1 [drp] via OPHTHALMIC
  Filled 2021-07-01: qty 5

## 2021-07-01 MED ORDER — ACETAMINOPHEN 325 MG PO TABS
650.0000 mg | ORAL_TABLET | ORAL | Status: DC | PRN
  Administered 2021-07-01 – 2021-07-03 (×4): 650 mg via ORAL
  Filled 2021-07-01 (×4): qty 2

## 2021-07-01 MED ORDER — SENNOSIDES-DOCUSATE SODIUM 8.6-50 MG PO TABS: 1.0000 | ORAL_TABLET | Freq: Every evening | ORAL | Status: DC | PRN

## 2021-07-01 MED ORDER — LABETALOL HCL 5 MG/ML IV SOLN: 20.0000 mg | INTRAVENOUS | Status: DC | PRN

## 2021-07-01 MED ORDER — LABETALOL HCL 5 MG/ML IV SOLN
20.0000 mg | INTRAVENOUS | Status: DC | PRN
Start: 2021-07-01 — End: 2021-07-01

## 2021-07-01 MED ORDER — LABETALOL HCL 5 MG/ML IV SOLN
20.0000 mg | INTRAVENOUS | Status: DC | PRN
  Administered 2021-07-01: 20 mg via INTRAVENOUS
  Filled 2021-07-01 (×2): qty 4

## 2021-07-01 MED ORDER — ACETAMINOPHEN 650 MG RE SUPP: 650.0000 mg | RECTAL | Status: DC | PRN

## 2021-07-01 MED ORDER — RIVAROXABAN 20 MG PO TABS: 20.0000 mg | ORAL_TABLET | Freq: Every day | ORAL | Status: DC

## 2021-07-01 MED ORDER — HYDRALAZINE HCL 20 MG/ML IJ SOLN
10.0000 mg | INTRAMUSCULAR | Status: DC | PRN
Start: 2021-07-01 — End: 2021-07-01

## 2021-07-01 MED ORDER — DIPHENHYDRAMINE HCL 50 MG/ML IJ SOLN
25.0000 mg | Freq: Once | INTRAMUSCULAR | Status: AC
  Administered 2021-07-01: 25 mg via INTRAVENOUS
  Filled 2021-07-01: qty 1

## 2021-07-01 MED ORDER — LABETALOL HCL 5 MG/ML IV SOLN
20.0000 mg | Freq: Once | INTRAVENOUS | Status: DC
  Filled 2021-07-01: qty 4

## 2021-07-01 MED ORDER — PANTOPRAZOLE SODIUM 40 MG PO TBEC
40.0000 mg | DELAYED_RELEASE_TABLET | Freq: Every day | ORAL | Status: DC
  Administered 2021-07-02 – 2021-07-03 (×2): 40 mg via ORAL
  Filled 2021-07-01 (×2): qty 1

## 2021-07-01 MED ORDER — PREDNISONE 10 MG PO TABS: 10.0000 mg | ORAL_TABLET | Freq: Every day | ORAL | Status: DC

## 2021-07-01 MED ORDER — ENOXAPARIN SODIUM 40 MG/0.4ML IJ SOSY: 40.0000 mg | PREFILLED_SYRINGE | INTRAMUSCULAR | Status: DC

## 2021-07-01 MED ORDER — LORAZEPAM 2 MG/ML IJ SOLN
2.0000 mg | INTRAMUSCULAR | Status: DC | PRN
Start: 2021-07-01 — End: 2021-07-04

## 2021-07-01 NOTE — ED Notes (Signed)
Pt bladder scanned. Highest amount scanned 769. Pt stated that she cannot urinate. Janna Arch - Consulting civil engineer aware.

## 2021-07-01 NOTE — Plan of Care (Signed)
On-call note  Patient at freestanding ER-received a call from Dr. 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 Do not see an emergent need for an LP at this point. Labs reassuring Hold azathioprine Blood pressure goal less than 160. Neurology will consult once admitted.  -- Milon Dikes, MD Neurologist [facility] Pager: [phone]

## 2021-07-01 NOTE — Plan of Care (Addendum)
On-call neurologist note  Received a call from Dr. Ophelia Charter. MR brain w+w/o completed.  Reviewed. Concerning findings with confluent T2/FLAIR hyperintense lesions in the right occipital white matter and patchy abnormal signal in the bilateral frontoparietal white matter and the white matter tracts of the brainstem.  Associated restricted diffusion in this area concerning for acute ischemia with patchy enhancement and possible acute hemorrhage in the left occipital lobe-not seen very well on the susceptibility images. 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  Other differentials remain broad including posterior reversible encephalopathy syndrome.  Given the family history of lymphoma, that remains in the differential as well and immune suppression related leukoencephalopathy could also be in the differentials.  Recommendations: - Hold Xarelto-we will need a spinal tap large-volume CSF with flow cytometry and cytology. - Spinal tap when about 48 hours from Xarelto. - No steroids for now as that might cloud the diagnosis on the CSF. - Hold the azathioprine as you are. -Given the concern for occipital bleed, I would not reverse Xarelto but controlled her blood pressures for now.  If there is any neuro decline, will consider reversal.  Blood pressure goal systolic less than 140.  - Given that her complex rheumatological and ophthalmological care is at [facility], I would push for a early transfer to [facility] where rheumatology, neurology and other specialties are able to assess this patient and treat her rather aggressively. With this amount of cerebral edema, there is always a high risk of herniation although at this time she does not seem to be exhibiting any signs clinically or radiologically pointing to herniation. IV steroids should be helpful but  again should be started only after the spinal tap has been completed.  I have discussed the case in detail with Dr. Melynda Ripple and Dr. Ophelia Charter.  Dr. Marlene Bast team will reach out to [facility] first thing for transfer-an attempt was made from the freestanding ER to reach [facility] but they had no beds at that time.  Bed situation not withstanding, she needs to be at [facility] for higher level of care and multiple other specialties which are not available at her institution including rheumatology.   -- Milon Dikes, MD Neurologist [facility] Pager: [phone]

## 2021-07-01 NOTE — ED Triage Notes (Signed)
Patient here POV from Home with Sister with Headache, Nausea, Vomiting and Weakness.  Patient returned from Trip in [place] and upon returning a Week Ago the Patient has been having Intermittent Headaches that has since progressed into Nausea and Vomiting.  Zofran at Home has been slightly relieving of Symptoms.   Patient is Legally Blind, Hx of Lupus, Vomiting during Triage, BIB Wheelchair, A&Ox4.

## 2021-07-01 NOTE — H&P (Signed)
History and Physical    Shelley Reese ZOX:[id number] DOB: 11/09/1966 DOA: 07/01/2021  PCP: Darrow BussingKoirala, Dibas, MD Consultants:  [facility]; Sherryll BurgerShah - ophthalmology  Patient coming from:  Home - lives with daughter; Jackey LogeOK: Daughter, [phone]  Chief Complaint: Headache, n/v  HPI: Shelley Reese is a 55 y.o. female with medical history significant of lupus with vasculitis/uveitis; visual impairment; and remote DVT presenting with headache, n/v, weakness.   She had been "sick" the week prior during a trip to [place] with n/v.  Thursday she started with a "raging" migraine that continued through the weekend.  She felt like she dehydrated - shakes, felt like her skin was dry.  [facility] and sent her to the ER.  She really just wants to go home.  She has never had a MRI.  She is very claustrophobic so we need to knock her out.  The n/v has resolved - although she last ate/drank last night.  The headache has resolved.  She only developed migraines after starting chemo for lupus.  She denies h/o HTN, usually 120/80.    ED [facility] per Dr. Loney Lohathore:  Patient with history of lupus, vasculitis/uveitis, on azathioprine presents to the ED with complaints of headache, nausea, vomiting x1 week.  Blood pressure elevated with systolic in the 170s and diastolic in the 110s.  CT showing findings concerning for PRES. ED physician spoke to Dr. Wilford CornerArora from neurology who recommends brain MRI, holding azathioprine, and keeping systolic blood pressure less than 160.  Neurology team will consult.  Patient was given labetalol.   Review of Systems: As per HPI; otherwise review of systems reviewed and negative.   Ambulatory Status:  Ambulates without assistance, uses a sight stick  COVID Vaccine Status:  Complete plus boosters x 2  Past Medical History:  Diagnosis Date   Acute deep vein thrombosis (DVT) of ulnar vein of left upper extremity (HCC) 01/10/2017    11/04/16 all veins from basilic/cephalic up through internal jugular; unprovoked   Asthma    Complication of anesthesia    following colonoscopy; spiked BP but was likely related to size of cuff; " one they gave me a bigger cuff it went right down "   GERD (gastroesophageal reflux disease)    Hoarseness of voice 01/10/2017   X1 year; reported at visit 01/10/17; nodules on voacal chords    Lupus (HCC)    Visual impairment    retinal vasculiits/uveitis    Past Surgical History:  Procedure Laterality Date   ESOPHAGEAL MANOMETRY N/A 03/09/2017   Procedure: ESOPHAGEAL MANOMETRY (EM);  Surgeon: Charna ElizabethJyothi Mann, MD;  [facility];  Service: Endoscopy;  Laterality: N/A;   HELLER MYOTOMY N/A 06/23/2017   Procedure: LAPAROSCOPIC HELLER MYOTOMY AND DOR FUNDOPLICATION;  Surgeon: Avel Peaceosenbower, Todd, MD;  Location: [facility];  Service: General;  Laterality: N/A;   VAGINAL DELIVERY      Social History   Socioeconomic History   Marital status: Single    Spouse name: Not on file   Number of children: 1   Years of education: Not on file   Highest education level: Not on file  Occupational History   Occupation: works at [employer]  Tobacco Use   Smoking status: Former    Types: Cigarettes    Quit date: 12/06/2002    Years since quitting: 18.5   Smokeless tobacco: Never   Tobacco comments:    Stopped x 14 yrs.  Vaping Use  Vaping Use: Never used  Substance and Sexual Activity   Alcohol use: Yes    Comment: very rare   Drug use: No   Sexual activity: Not on file  Other Topics Concern   Not on file  Social History Narrative   Not on file   Social Determinants of Health   Financial Resource Strain: Not on file  Food Insecurity: Not on file  Transportation Needs: Not on file  Physical Activity: Not on file  Stress: Not on file  Social Connections: Not on file  Intimate Partner Violence: Not on file    Allergies  Allergen Reactions   Amoxicillin Other (See Comments)    GI  Upset Has patient had a PCN reaction causing immediate rash, facial/tongue/throat swelling, SOB or lightheadedness with hypotension: No Has patient had a PCN reaction causing severe rash involving mucus membranes or skin necrosis: No Has patient had a PCN reaction that required hospitalization: No Has patient had a PCN reaction occurring within the last 10 years: Yes--GI upset occurred within 10 years. If all of the above answers are "NO", then may proceed with Cephalosporin use.     Family History  Problem Relation Age of Onset   Heart disease Mother    Deep vein thrombosis Mother    Lung cancer Mother        used to smoke, exposure to asbestos   Mitral valve prolapse Mother    Lupus Mother    Heart disease Father    Allergies Sister    Allergies Brother    Colon cancer Maternal Grandmother    Liver cancer Maternal Grandmother    Colon cancer Paternal Grandmother    Bone cancer Maternal Aunt    Breast cancer Paternal Aunt     Prior to Admission medications   Medication Sig Start Date End Date Taking? Authorizing Provider  azaTHIOprine (IMURAN) 50 MG tablet Take 100 mg by mouth 2 (two) times daily. 04/29/21   [provider]  budesonide-formoterol (SYMBICORT) 80-4.5 MCG/ACT inhaler Inhale 2 puffs into the lungs 2 (two) times daily. 05/23/17   Nyoka Cowden, MD  cetirizine (ZYRTEC) 10 MG tablet Take 10 mg by mouth daily as needed for allergies.     [provider]  dexlansoprazole (DEXILANT) 60 MG capsule Take 60 mg by mouth daily.    [provider]  dextromethorphan-guaiFENesin (MUCINEX DM) 30-600 MG 12hr tablet Take 1 tablet by mouth 2 (two) times daily as needed for cough.    [provider]  famotidine (PEPCID) 20 MG tablet One at bedtime Patient taking differently: Take 20 mg by mouth daily as needed for heartburn.  02/11/17   Nyoka Cowden, MD  oxyCODONE (OXY IR/ROXICODONE) 5 MG immediate release tablet Take 1-2 tablets (5-10 mg total) by  mouth every 4 (four) hours as needed for moderate pain. 06/24/17   Avel Peace, MD  PARoxetine (PAXIL) 10 MG tablet Take 10 mg by mouth daily.    [provider]  XARELTO 20 MG TABS tablet Take 20 mg by mouth daily with breakfast. with food 06/02/17   [provider]    Physical Exam: Vitals:   07/01/21 1100 07/01/21 1112 07/01/21 1155 07/01/21 1611  BP: 130/84  (!) 140/109 136/90  Pulse: 85  85 80  Resp: 15  16 18   Temp:  98.7 F (37.1 C) 97.9 F (36.6 C) 97.9 F (36.6 C)  TempSrc:  Oral Oral Oral  SpO2: 98%  99% 99%  Weight:   105.6  kg   Height:         General:  Appears calm and comfortable and is in NAD Eyes:  Mildly asynchronus gaze with apparent visual impairment ENT:  grossly normal hearing, lips & tongue, mmm; appropriate dentition Neck:  no LAD, masses or thyromegaly Cardiovascular:  RRR, no m/r/g. No LE edema.  Respiratory:   CTA bilaterally with no wheezes/rales/rhonchi.  Normal respiratory effort. Abdomen:  soft, NT, ND Back:   normal alignment, no CVAT Skin:  no rash or induration seen on limited exam Musculoskeletal:  grossly normal tone BUE/BLE, good ROM, no bony abnormality Psychiatric:  blunted mood and affect, speech fluent and appropriate, AOx3 Neurologic:  CN 2-12 grossly intact, moves all extremities in coordinated fashion    Radiological Exams on Admission: Independently reviewed - see discussion in A/P where applicable  CT Head Wo Contrast  Result Date: 07/01/2021 CLINICAL DATA:  Initial evaluation for acute headache, nausea, vomiting. EXAM: CT HEAD WITHOUT CONTRAST TECHNIQUE: Contiguous axial images were obtained from the base of the skull through the vertex without intravenous contrast. COMPARISON:  None available. FINDINGS: Brain: Cerebral volume within normal limits for age. There is heterogeneous patchy hypodensity involving the cortical and subcortical aspect of the bilateral parieto-occipital regions, left slightly greater  than right. Overall, appearance is felt to be most consistent with PRES. No associated hemorrhage or significant regional mass effect. No other acute intracranial hemorrhage or large vessel territory infarct. No mass lesion or midline shift. No hydrocephalus or extra-axial fluid collection. Vascular: No hyperdense vessel. Skull: Scalp soft tissues and calvarium within normal limits. Sinuses/Orbits: Postoperative changes noted at the left globe. Prior ocular lens replacement at the right globe. Globes and orbital soft tissues demonstrate no acute finding. Paranasal sinuses and mastoid air cells are clear. Other: None. IMPRESSION: 1. Heterogeneous hypodensities involving the cortical and subcortical aspect of the bilateral parieto-occipital regions, left slightly worse than right. Findings felt to be most consistent with PRES. No associated hemorrhage or significant regional mass effect. Further assessment with dedicated MRI suggested for further evaluation. 2. No other acute intracranial abnormality. Electronically Signed   By: Rise Mu M.D.   On: 07/01/2021 02:37    EKG: not done   Labs on Admission: I have personally reviewed the available labs and imaging studies at the time of the admission.  Pertinent labs:   Glucose 148 Bili 1.3 Normal CBC COVID/flu negative   Assessment/Plan Principal Problem:   Posterior reversible encephalopathy syndrome Active Problems:   Lupus (systemic lupus erythematosus) (HCC)   Visual impairment severe bilaterally   Class 1 obesity due to excess calories with body mass index (BMI) of 34.0 to 34.9 in adult   PRES -Patient presenting with headache, weakness after GI illness -She has h/o lupus -Imaging at [facility] was concerning for PRES -She does not have known h/o HTN; there appears to be a relationship with PRES and systemic lupus and also with immunosuppressive treatment -Will admit to progressive -MRI has been ordered for further  evaluation -Neurology consult is pending -This condition is usually reversible with appropraite management -Needs good BP management, <160 -Immunosuppressants should be reduced or stopped if possible; if an alternative agent is started, close surveillance is encouraged -PT/OT consults requested  Lupus -Patient is followed by rheum -Given PRES as above, will hold Imuran -Continue prednisone at current dose for now - unless neurology recommends otherwise  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  -Weight loss should be encouraged -Outpatient PCP/bariatric medicine f/u encouraged     Note: This patient has been tested and is negative for the novel coronavirus COVID-19. The patient has been fully vaccinated against COVID-19.   Level of care: Progressive DVT prophylaxis: Xarelto Code Status:  Full - confirmed with patient Family Communication: None present Disposition Plan:  The patient is from: home  Anticipated d/c is to: home without [facility] services   Anticipated d/c date will depend on clinical response to treatment, likely 2-3 days  Patient is currently: acutely ill Consults called: Neurology; Nutrition/PT/OT/TOC team  Admission status:  Admit - It is my clinical opinion that admission to INPATIENT is reasonable and necessary because of the expectation that this patient will require hospital care that crosses at least 2 midnights to treat this condition based on the medical complexity of the problems presented.  Given the aforementioned information, the predictability of an adverse outcome is felt to be significant.    Jonah Blue MD [facility]   How to contact the [facility] Attending or Consulting provider 7A - 7P or covering provider  during after hours 7P -7A, for this patient?  Check the care team in [facility] and look for a) attending/consulting TR[facility] provider listed and b) the [facility] team listed Log into [email or web address] and use [place] universal password to access. If you do not have the password, please contact the hospital operator. Locate the [facility] provider you are looking for under [facility] and page to a number that you can be directly reached. If you still have difficulty reaching the provider, please page the [facility] (Director on Call) for the Hospitalists listed on amion for assistance.   07/01/2021, 4:44 PM

## 2021-07-01 NOTE — ED Provider Notes (Signed)
[facility] EMERGENCY DEPT Provider Note   CSN: [id number] Arrival date & time: 07/01/21  0001     History Chief Complaint  Patient presents with   Nausea   Emesis    Shelley Reese is a 55 y.o. female.  The history is provided by the patient.  Emesis She has history of asthma, GERD, achalasia, retinal vasculitis and comes in because of nausea and headaches.  She returned from vacation in [place] 1 week ago and the next day, started having nausea and vomiting and intermittent headaches.  Headaches are different from the headaches that she has had previously.  She cannot localize some of the states she is currently not having a headache, but it is more severe than prior headaches had been.  She has taken ondansetron for nausea, but it has not been helping.  She has not been able to eat or drink anything because of the nausea.  No one else who was on the trip has been sick.  She denies fever but has had some chills.  There has been no diarrhea.   Past Medical History:  Diagnosis Date   Acute deep vein thrombosis (DVT) of ulnar vein of left upper extremity (HCC) 01/10/2017   11/04/16 all veins from basilic/cephalic up through internal jugular; unprovoked   Asthma    Complication of anesthesia    following colonoscopy; spiked BP but was likely related to size of cuff; " one they gave me a bigger cuff it went right down "   GERD (gastroesophageal reflux disease)    Hoarseness of voice 01/10/2017   X1 year; reported at visit 01/10/17; nodules on voacal chords     Patient Active Problem List   Diagnosis Date Noted   Achalasia s/p laparoscopic Heller myotomy an Dor fundoplication 06/23/17 06/23/2017   Cough 03/09/2017   Pulmonary infiltrates 02/13/2017   Acute deep vein thrombosis (DVT) of ulnar vein of left upper extremity (HCC) 01/10/2017   Hoarseness of voice 01/10/2017   Cough variant asthma vs UACS 11/27/2011    Past Surgical History:  Procedure Laterality Date    ESOPHAGEAL MANOMETRY N/A 03/09/2017   Procedure: ESOPHAGEAL MANOMETRY (EM);  Surgeon: Charna Elizabeth, MD;  [facility]: [facility];  Service: Endoscopy;  Laterality: N/A;   HELLER MYOTOMY N/A 06/23/2017   Procedure: LAPAROSCOPIC HELLER MYOTOMY AND DOR FUNDOPLICATION;  Surgeon: Avel Peace, MD;  Location: [facility];  Service: General;  Laterality: N/A;   VAGINAL DELIVERY       OB History   No obstetric history on file.     Family History  Problem Relation Age of Onset   Heart disease Father    Heart disease Mother    Deep vein thrombosis Mother    Lung cancer Mother        used to smoke, exposure to asbestos   Mitral valve prolapse Mother    Lupus Mother    Allergies Brother    Allergies Sister    Colon cancer Paternal Grandmother    Colon cancer Maternal Grandmother    Liver cancer Maternal Grandmother    Bone cancer Maternal Aunt    Breast cancer Paternal Aunt     Social History   Tobacco Use   Smoking status: Former    Types: Cigarettes    Quit date: 12/06/2002    Years since quitting: 18.5   Smokeless tobacco: Never   Tobacco comments:    Stopped x 14 yrs.  Vaping Use   Vaping Use: Never used  Substance Use  Topics   Alcohol use: Yes    Comment: very rare   Drug use: No    Home Medications Prior to Admission medications   Medication Sig Start Date End Date Taking? Authorizing Provider  budesonide-formoterol (SYMBICORT) 80-4.5 MCG/ACT inhaler Inhale 2 puffs into the lungs 2 (two) times daily. 05/23/17   Nyoka Cowden, MD  cetirizine (ZYRTEC) 10 MG tablet Take 10 mg by mouth daily as needed for allergies.     [provider]  dexlansoprazole (DEXILANT) 60 MG capsule Take 60 mg by mouth daily.    [provider]  dextromethorphan-guaiFENesin (MUCINEX DM) 30-600 MG 12hr tablet Take 1 tablet by mouth 2 (two) times daily as needed for cough.    [provider]  famotidine (PEPCID) 20 MG tablet One at bedtime Patient taking differently: Take 20  mg by mouth daily as needed for heartburn.  02/11/17   Nyoka Cowden, MD  oxyCODONE (OXY IR/ROXICODONE) 5 MG immediate release tablet Take 1-2 tablets (5-10 mg total) by mouth every 4 (four) hours as needed for moderate pain. 06/24/17   Avel Peace, MD  PARoxetine (PAXIL) 10 MG tablet Take 10 mg by mouth daily.    [provider]  XARELTO 20 MG TABS tablet Take 20 mg by mouth daily with breakfast. with food 06/02/17   [provider]    Allergies    Amoxicillin  Review of Systems   Review of Systems  Gastrointestinal:  Positive for vomiting.  All other systems reviewed and are negative.  Physical Exam Updated Vital Signs BP (!) 165/119 (BP Location: Right Arm)   Pulse 96   Temp 99 F (37.2 C) (Oral)   Resp 16   Ht 5\' 9"  (1.753 m)   Wt 104.3 kg   SpO2 99%   BMI 33.97 kg/m   Physical Exam Vitals and nursing note reviewed.  55 year old female, resting comfortably and in no acute distress. Vital signs are significant for elevated blood pressure. Oxygen saturation is 99%, which is normal. Head is normocephalic and atraumatic.  Pupils are 3 mm, EOMI. Oropharynx is clear. Neck is nontender and supple without adenopathy or JVD. Back is nontender and there is no CVA tenderness. Lungs are clear without rales, wheezes, or rhonchi. Chest is nontender. Heart has regular rate and rhythm without murmur. Abdomen is soft, flat, nontender without masses or hepatosplenomegaly and peristalsis is hypoactive. Extremities have no cyanosis or edema, full range of motion is present. Skin is warm and dry without rash. Neurologic: Mental status is normal, cranial nerves are intact, there are no motor or sensory deficits.  ED Results / Procedures / Treatments   Labs (all labs ordered are listed, but only abnormal results are displayed) Labs Reviewed  COMPREHENSIVE METABOLIC PANEL - Abnormal; Notable for the following components:      Result Value   Glucose, Bld 148 (*)     Total Bilirubin 1.3 (*)    All other components within normal limits  RESP PANEL BY RT-PCR (FLU A&B, COVID) ARPGX2  CBC WITH DIFFERENTIAL/PLATELET    Radiology CT Head Wo Contrast  Result Date: 07/01/2021 CLINICAL DATA:  Initial evaluation for acute headache, nausea, vomiting. EXAM: CT HEAD WITHOUT CONTRAST TECHNIQUE: Contiguous axial images were obtained from the base of the skull through the vertex without intravenous contrast. COMPARISON:  None available. FINDINGS: Brain: Cerebral volume within normal limits for age. There is heterogeneous patchy hypodensity involving the cortical and subcortical aspect of the bilateral parieto-occipital regions, left slightly  greater than right. Overall, appearance is felt to be most consistent with PRES. No associated hemorrhage or significant regional mass effect. No other acute intracranial hemorrhage or large vessel territory infarct. No mass lesion or midline shift. No hydrocephalus or extra-axial fluid collection. Vascular: No hyperdense vessel. Skull: Scalp soft tissues and calvarium within normal limits. Sinuses/Orbits: Postoperative changes noted at the left globe. Prior ocular lens replacement at the right globe. Globes and orbital soft tissues demonstrate no acute finding. Paranasal sinuses and mastoid air cells are clear. Other: None. IMPRESSION: 1. Heterogeneous hypodensities involving the cortical and subcortical aspect of the bilateral parieto-occipital regions, left slightly worse than right. Findings felt to be most consistent with PRES. No associated hemorrhage or significant regional mass effect. Further assessment with dedicated MRI suggested for further evaluation. 2. No other acute intracranial abnormality. Electronically Signed   By: Rise MuBenjamin  McClintock M.D.   On: 07/01/2021 02:37    Procedures Procedures  CRITICAL CARE Performed by: Dione Boozeavid Knoxx Boeding Total critical care time: 60 minutes Critical care time was exclusive of separately billable  procedures and treating other patients. Critical care was necessary to treat or prevent imminent or life-threatening deterioration. Critical care was time spent personally by me on the following activities: development of treatment plan with patient and/or surrogate as well as nursing, discussions with consultants, evaluation of patient's response to treatment, examination of patient, obtaining history from patient or surrogate, ordering and performing treatments and interventions, ordering and review of laboratory studies, ordering and review of radiographic studies, pulse oximetry and re-evaluation of patient's condition.  Medications Ordered in ED Medications  labetalol (NORMODYNE) injection 20 mg (20 mg Intravenous Not Given 07/01/21 0448)  lactated ringers bolus 1,000 mL (0 mLs Intravenous Stopped 07/01/21 0444)  prochlorperazine (COMPAZINE) injection 10 mg (10 mg Intravenous Given 07/01/21 0136)  diphenhydrAMINE (BENADRYL) injection 25 mg (25 mg Intravenous Given 07/01/21 0136)  labetalol (NORMODYNE) injection 20 mg (20 mg Intravenous Given 07/01/21 0405)    ED Course  I have reviewed the triage vital signs and the nursing notes.  Pertinent labs & imaging results that were available during my care of the patient were reviewed by me and considered in my medical decision making (see chart for details).   MDM Rules/Calculators/A&P                         Nausea and vomiting of uncertain cause.  New headache undetermined cause.  Elevated blood pressure.  Old records were reviewed, and I do not see CT of head as part of her work-up either here or [facility].  She will be sent for CT of head.  She will be given IV fluids and will check screening labs.  She has failed ondansetron, will try Compazine.  She feels much better following above-noted treatment.  Labs are reassuring.  However, CT of head is concerning for posterior reversible encephalopathy syndrome.  Headache and  elevated blood pressure are consistent with this.  She is also on immunosuppressive medication which can be a predisposing factor.  I reviewed the case with Dr. Wilford CornerArora, neuro hospital[facility].  He has reviewed her record and her CT scan and feels that she should be admitted to the hospital for blood pressure control and also to get an MRI of the brain to confirm diagnosis.  Patient[facility] and she requests to be transferred there.  I did  contact [facility] through the PALS line, but they are not excepting transfers at this point.  Case is discussed with Dr. Loney Loh, hospitalist at [facility], who agrees to admit the patient.  She is given a dose of labetalol and blood pressure has responded well.  She will need to have her blood pressure monitored closely.  Blood pressure came down well with labetalol, will need to be watched closely.  Final Clinical Impression(s) / ED Diagnoses Final diagnoses:  PRES (posterior reversible encephalopathy syndrome)  Elevated blood pressure reading with diagnosis of hypertension  Non-intractable vomiting with nausea, unspecified vomiting type    Rx / DC Orders ED Discharge Orders     None        Dione Booze, MD 07/01/21 [phone]

## 2021-07-01 NOTE — ED Notes (Signed)
Report called to unit RN. °

## 2021-07-01 NOTE — Consult Note (Signed)
Neurology Consultation Reason for Consult: headache Referring Physician: Dr Jonah Blue  CC: headache  History is obtained from: patient  HPI: Shelley Reese is a 55 y.o. female with history of lupus on azathioprine who presented with nausea, vomiting and headache. She states she started having nausea and vomiting ( 5-6 times daily) about a week ago. Its been waxing and waning in frequency but she was feeling very weak and eventually decided to come to ER. She also reported intermittent holocephalic headache, pressure like, non radiating. She said she has had similar headaches on and off in past one year, usually when her prednisone dose is higher ( around 60mg  daily) and at times when headache is severe, its associated with vision loss. States her vision is currently worse than her baseline vision ( at baseline can see faces but right now can barely see outline and light). CT head was done in ER and was suggestive of PRES. Therefore neurology was consulted.   Patient denies HTN, new medications, illicit drug use. States her mother had similar symptoms and was diagnosed with non-hodgkins lymphoma with mets to brain.   ROS: All other systems reviewed and negative except as noted in the HPI.   Past Medical History:  Diagnosis Date   Acute deep vein thrombosis (DVT) of ulnar vein of left upper extremity (HCC) 01/10/2017   11/04/16 all veins from basilic/cephalic up through internal jugular; unprovoked   Asthma    Complication of anesthesia    following colonoscopy; spiked BP but was likely related to size of cuff; " one they gave me a bigger cuff it went right down "   GERD (gastroesophageal reflux disease)    Hoarseness of voice 01/10/2017   X1 year; reported at visit 01/10/17; nodules on voacal chords    Lupus (HCC)    Visual impairment    retinal vasculiits/uveitis    Family History  Problem Relation Age of Onset   Heart disease Mother    Deep vein thrombosis Mother    Lung cancer  Mother        used to smoke, exposure to asbestos   Mitral valve prolapse Mother    Lupus Mother    Heart disease Father    Allergies Sister    Allergies Brother    Colon cancer Maternal Grandmother    Liver cancer Maternal Grandmother    Colon cancer Paternal Grandmother    Bone cancer Maternal Aunt    Breast cancer Paternal Aunt     Social History:  reports that she quit smoking about 18 years ago. Her smoking use included cigarettes. She has never used smokeless tobacco. She reports current alcohol use. She reports that she does not use drugs.  Medications Prior to Admission  Medication Sig Dispense Refill Last Dose   azaTHIOprine (IMURAN) 50 MG tablet Take 100 mg by mouth 2 (two) times daily.      budesonide-formoterol (SYMBICORT) 80-4.5 MCG/ACT inhaler Inhale 2 puffs into the lungs 2 (two) times daily. 1 Inhaler 50    cetirizine (ZYRTEC) 10 MG tablet Take 10 mg by mouth daily as needed for allergies.       dexlansoprazole (DEXILANT) 60 MG capsule Take 60 mg by mouth daily.      dextromethorphan-guaiFENesin (MUCINEX DM) 30-600 MG 12hr tablet Take 1 tablet by mouth 2 (two) times daily as needed for cough.      famotidine (PEPCID) 20 MG tablet One at bedtime (Patient taking differently: Take 20 mg by mouth daily as needed for  heartburn. ) 20 tablet 0    oxyCODONE (OXY IR/ROXICODONE) 5 MG immediate release tablet Take 1-2 tablets (5-10 mg total) by mouth every 4 (four) hours as needed for moderate pain. 30 tablet 0    PARoxetine (PAXIL) 10 MG tablet Take 10 mg by mouth daily.      XARELTO 20 MG TABS tablet Take 20 mg by mouth daily with breakfast. with food  2       Exam: Current vital signs: BP (!) 140/109 (BP Location: Right Arm)   Pulse 85   Temp 97.9 F (36.6 C) (Oral)   Resp 16   Ht 5\' 9"  (1.753 m)   Wt 105.6 kg   SpO2 99%   BMI 34.39 kg/m  Vital signs in last 24 hours: Temp:  [97.9 F (36.6 C)-99 F (37.2 C)] 97.9 F (36.6 C) (07/27 1155) Pulse Rate:  [76-96] 85  (07/27 1155) Resp:  [15-28] 16 (07/27 1155) BP: (130-174)/(81-119) 140/109 (07/27 1155) SpO2:  [92 %-100 %] 99 % (07/27 1155) Weight:  [104.3 kg-105.6 kg] 105.6 kg (07/27 1155)   Physical Exam  Constitutional: Appears well-developed and well-nourished.  Psych: Affect appropriate to situation Eyes: No scleral injection HENT: No OP obstrucion Head: Normocephalic.  Cardiovascular: Normal rate and regular rhythm.  Respiratory: Effort normal, non-labored breathing GI: Soft.  No distension. There is no tenderness.  Skin: Warm, no apparent ulcers Neuro: Aox3, CN 2-12 grossly intact, 5/5 in all extremities, sensory intact to light touch   I have reviewed labs in epic and the results pertinent to this consultation are: CBC:  Recent Labs  Lab 07/01/21 0100  WBC 6.6  NEUTROABS 4.5  HGB 14.0  HCT 41.3  MCV 91.2  PLT 371    Basic Metabolic Panel:  Lab Results  Component Value Date   NA 138 07/01/2021   K 3.6 07/01/2021   CO2 22 07/01/2021   GLUCOSE 148 (H) 07/01/2021   BUN 10 07/01/2021   CREATININE 0.76 07/01/2021   CALCIUM 9.8 07/01/2021   GFRNONAA >60 07/01/2021   GFRAA >60 06/24/2017   Lipid Panel: No results found for: LDLCALC HgbA1c: No results found for: HGBA1C Urine Drug Screen: No results found for: LABOPIA, COCAINSCRNUR, LABBENZ, AMPHETMU, THCU, LABBARB  Alcohol Level No results found for: ETH   I have reviewed the images obtained:  CT Head without contrast 07/01/2021: Heterogeneous hypodensities involving the cortical and subcortical aspect of the bilateral parieto-occipital regions, left slightly worse than right. Findings felt to be most consistent with PRES. No associated hemorrhage or significant regional mass effect.     ASSESSMENT/PLAN: 77 old female who presented with headache, nausea and vomiting.  CT head was performed concerning for PRES.  Posterior reversible encephalopathy syndrome ( PRES) Hyperbilirubinemia Nausea/ Vomiting - Suspected due to  azathioprine use  Recommendations: - Hold azathioprine for now, continue rest of rhe home meds - Obtain mri brain wo and wo contrast to assess for acute intracranial abnormalities. PO ativan prior to MRI as patient reported being claustrophobic.  - Depending on MRI results, will consider CTA, LP - Goal normotension - PRN zofran for N/V - Management of rest of the comorbidities per primary team  I have spent a total of  125 minutes with the patient reviewing hospital notes,  test results, labs and examining the patient as well as establishing an assessment and plan that was discussed personally with the patient.  > 50% of time was spent in direct patient care.   [facility]  neurohospitalist

## 2021-07-01 NOTE — Progress Notes (Signed)
   07/01/21 2000  Neurological  Neuro (WDL) X  Orientation Level Oriented to person;Oriented to place;Oriented to situation  Speech Clear  R Pupil Size (mm) 4  R Pupil Reaction Sluggish  L Pupil Size (mm) 3  L Pupil Reaction Sluggish  Facial Symmetry Symmetrical   Patient received Ativan 4mg  PO prior to MRI and is still sleeping it off. She is alert to physical stimulation and responding appropriately, but still sleeping heavily. VSS. She is disoriented to time currently because of her heavy sleeping. SBP in 130s. She is calm and resting. Will continue to monitor.

## 2021-07-02 ENCOUNTER — Inpatient Hospital Stay (HOSPITAL_COMMUNITY): Payer: BC Managed Care – PPO

## 2021-07-02 DIAGNOSIS — I6783 Posterior reversible encephalopathy syndrome: Principal | ICD-10-CM

## 2021-07-02 LAB — URINALYSIS, COMPLETE (UACMP) WITH MICROSCOPIC
Bacteria, UA: NONE SEEN
Bilirubin Urine: NEGATIVE
Glucose, UA: NEGATIVE mg/dL
Ketones, ur: 5 mg/dL — AB
Leukocytes,Ua: NEGATIVE
Nitrite: NEGATIVE
Protein, ur: NEGATIVE mg/dL
RBC / HPF: >50 RBC/hpf — ABNORMAL HIGH (ref 0–5)
Specific Gravity, Urine: 1.023 (ref 1.005–1.030)
pH: 6 (ref 5.0–8.0)

## 2021-07-02 LAB — CSF CELL COUNT WITH DIFFERENTIAL
RBC Count, CSF: 0 /mm3
RBC Count, CSF: 1 /mm3 — ABNORMAL HIGH
Tube #: 1
Tube #: 4
WBC, CSF: 3 /mm3 (ref 0–5)
WBC, CSF: 4 /mm3 (ref 0–5)

## 2021-07-02 LAB — LIPID PANEL
Cholesterol: 226 mg/dL — ABNORMAL HIGH (ref 0–200)
HDL: 36 mg/dL — ABNORMAL LOW (ref >40)
LDL Cholesterol: 161 mg/dL — ABNORMAL HIGH (ref 0–99)
Total CHOL/HDL Ratio: 6.3 RATIO
Triglycerides: 147 mg/dL (ref <150)
VLDL: 29 mg/dL (ref 0–40)

## 2021-07-02 LAB — PROTEIN AND GLUCOSE, CSF
Glucose, CSF: 57 mg/dL (ref 40–70)
Total  Protein, CSF: 104 mg/dL — ABNORMAL HIGH (ref 15–45)

## 2021-07-02 LAB — SEDIMENTATION RATE: Sed Rate: 9 mm/hr (ref 0–22)

## 2021-07-02 LAB — HIV ANTIBODY (ROUTINE TESTING W REFLEX): HIV Screen 4th Generation wRfx: NONREACTIVE

## 2021-07-02 MED ORDER — TAMSULOSIN HCL 0.4 MG PO CAPS
0.4000 mg | ORAL_CAPSULE | Freq: Every day | ORAL | Status: DC
  Administered 2021-07-02 – 2021-07-03 (×2): 0.4 mg via ORAL
  Filled 2021-07-02 (×2): qty 1

## 2021-07-02 MED ORDER — KETOROLAC TROMETHAMINE 15 MG/ML IJ SOLN
15.0000 mg | Freq: Once | INTRAMUSCULAR | Status: AC
  Administered 2021-07-02: 15 mg via INTRAVENOUS
  Filled 2021-07-02: qty 1

## 2021-07-02 MED ORDER — IOHEXOL 350 MG/ML SOLN
75.0000 mL | Freq: Once | INTRAVENOUS | Status: AC | PRN
  Administered 2021-07-02: 75 mL via INTRAVENOUS

## 2021-07-02 MED ORDER — LIDOCAINE HCL (PF) 1 % IJ SOLN
5.0000 mL | Freq: Once | INTRAMUSCULAR | Status: AC
  Administered 2021-07-02: 5 mL
  Filled 2021-07-02: qty 30

## 2021-07-02 MED ORDER — KATE FARMS STANDARD 1.4 PO LIQD
325.0000 mL | Freq: Two times a day (BID) | ORAL | Status: DC
  Administered 2021-07-02 – 2021-07-03 (×3): 325 mL via ORAL
  Filled 2021-07-02 (×5): qty 325

## 2021-07-02 MED ORDER — ADULT MULTIVITAMIN W/MINERALS CH
1.0000 | ORAL_TABLET | Freq: Every day | ORAL | Status: DC
  Administered 2021-07-02 – 2021-07-03 (×2): 1 via ORAL
  Filled 2021-07-02 (×2): qty 1

## 2021-07-02 NOTE — Progress Notes (Signed)
PROGRESS NOTE    Shelley Reese  HER:[id number] DOB: [birth year] DOA: 07/01/2021 PCP: Darrow Bussing, MD   Chief Complaint  Patient presents with   Nausea   Emesis    Brief Narrative: 55 year old female with history of lupus with vasculitis/uveitis, visual impairment, remote DVT history on Xarelto presents with a headache, nausea, vomiting and weakness.  She had been "sick" week prior during a trip to [facility] with nausea and vomiting and on last Thursday started with a "raging" migraine.  She was seen in urgent care and ED CT head and sent to the ED.  In the ED blood pressure elevated in 170s CT head + features concerning for PR ES ED consult with neurology and underwent MRI brain holding azathioprine and Xarelto.  MRI brain with and without with confluent T2/FL AIR hyperintense lesions in the right occipital white matter, patchy abnormal signal in the bilateral frontotemporal white matter and white matter tracts of the brainstem, associated restricted diffusion in this area concerning for acute ischemia with patchy enhancement and possible acute hemorrhage in the left occipital lobe not seen very well on the susceptibility images as per neurology this findings compatible with acute severe leukoencephalopathy, which is nonspecific but given history of lupus could be secondary to lupus cerebritis/vasculitis other differential remains broad including PR ES and given family history of lymphoma remains the differential and immunosuppressed related leukoencephalopathy  Per neurology Xarelto was held as she will need spinal tap large-volume CSF with flow cytometry, cytology 48 hours from Xarelto, no steroids for now. Also suggested transfer to [facility] where patient receives her care given rheumatology neurology and other specialities availability there. Dr. Wilford Corner discussed w/ Dr.  Ophelia Charter admitting MD- for transfer to [facility] but patient was admitted as there was no bed available.  Subjective: Seen  and examined this morning. Patient reports she is able to see someone in the right eye but not with the left eye and this is for few years Complains of mild frontal headache No other new complaints Assessment & Plan:  Nausea vomiting headache weakness with MRI brain showing features of severe leukoencephalopathy-differential lupus cerebritis associated, POTS, family history of lymphoma also differential, and also immunosuppressant related leukoencephalopathy differential. Holding Xarelto in anticipation of large-volume CSF tap for flow cytometry and cytology 48 hours after Xarelto.  Holding prednisone po to increase LP sensitivity ( lymphoma in mother). Attempting to transfer to [facility]- Where they have her rheumatology service.  Patient is known to [facility].  Continue to optimize blood pressure control  History of lupus uveitis/lesion Lupus Holding azathioprine, and PO prednisone Hx of DVT- again holding xarelto for LP Morbid obesity with BMI 34: OP pcp f/u wt loss strategy  Waiting for transfer to [facility] hospital: [facility] PAL line was called by neurology and confirmed patient has been accepted in waiting list pending bed availability  Diet Order             Diet Heart Room service appropriate? Yes; Fluid consistency: Thin  Diet effective now                 Patient's Body mass index is 34.39 kg/m. DVT prophylaxis: scd Code Status:   Code Status: Full Code  Family Communication: plan of care discussed with patient at bedside. Status is: Inpatient Remains inpatient appropriate because:Ongoing diagnostic testing needed not appropriate for outpatient work up and Inpatient level of care appropriate due to severity of illness Dispo: The patient is from: Home  Anticipated d/c is to:  TBD              Patient currently is not medically stable to d/c.   Difficult to place patient No Unresulted Labs (From admission, onward)     Start     Ordered    07/02/21 0500  HIV Antibody (routine testing w rflx)  (HIV Antibody (Routine testing w reflex) panel)  Tomorrow morning,   R        07/01/21 1432   07/02/21 0500  Hemoglobin A1c  (Labs)  Tomorrow morning,   R        07/01/21 1432   07/01/21 1431  Urine rapid drug screen (hosp performed)not at [facility])  Once,   R        07/01/21 1432           Medications reviewed:  Scheduled Meds:  dorzolamide-timolol  1 drop Left Eye BID   pantoprazole  40 mg Oral Daily   prednisoLONE acetate  1 drop Right Eye Q4H while awake   predniSONE  10 mg Oral Q breakfast   Continuous Infusions:  sodium chloride 50 mL/hr (07/01/21 1819)   Consultants:see note  Procedures:see note Antimicrobials: Anti-infectives (From admission, onward)    None      Culture/Microbiology No results found for: SDES, SPECREQUEST, CULT, REPTSTATUS  Other culture-see note  Objective: Vitals: Today's Vitals   07/02/21 0334 07/02/21 0645 07/02/21 0658 07/02/21 0800  BP:  (!) 144/95 108/74 (!) 158/95  Pulse:    83  Resp:    17  Temp:    98 F (36.7 C)  TempSrc:    Oral  SpO2:      Weight:      Height:      PainSc: Asleep       Intake/Output Summary (Last 24 hours) at 07/02/2021 0926 Last data filed at 07/02/2021 0240 Gross per 24 hour  Intake 75 ml  Output 1300 ml  Net -1225 ml   Filed Weights   07/01/21 0015 07/01/21 1155  Weight: 104.3 kg 105.6 kg   Weight change: 1.315 kg  Intake/Output from previous day: 07/27 0701 - 07/28 0700 In: 75 [P.O.:75] Out: 1300 [Urine:1300] Intake/Output this shift: No intake/output data recorded. Filed Weights   07/01/21 0015 07/01/21 1155  Weight: 104.3 kg 105.6 kg   Examination: General exam: AAO X3, obese, pleasant, not in acute distress. HEENT:Oral mucosa moist, Ear/Nose WNL grossly,dentition normal. Respiratory system: bilaterally clear, no use of accessory muscle, non tender. Cardiovascular system: S1 & S2 +,No JVD. Gastrointestinal system: Abdomen  soft, NT,ND, BS+. Nervous System:Alert, awake, moving extremities, legally blind. Extremities: no edema, distal peripheral pulses palpable.  Skin: No rashes,no icterus. MSK: Normal muscle bulk,tone, power Data Reviewed: I have personally reviewed following labs and imaging studies CBC: Recent Labs  Lab 07/01/21 0100  WBC 6.6  NEUTROABS 4.5  HGB 14.0  HCT 41.3  MCV 91.2  PLT 371   Basic Metabolic Panel: Recent Labs  Lab 07/01/21 0100  NA 138  K 3.6  CL 103  CO2 22  GLUCOSE 148*  BUN 10  CREATININE 0.76  CALCIUM 9.8   GFR: Estimated Creatinine Clearance: 102.9 mL/min (by C-G formula based on SCr of 0.76 mg/dL). Liver Function Tests: Recent Labs  Lab 07/01/21 0100  AST 15  ALT 20  ALKPHOS 47  BILITOT 1.3*  PROT 7.0  ALBUMIN 4.4   No results for input(s): LIPASE, AMYLASE in the last 168 hours. No results for  input(s): AMMONIA in the last 168 hours. Coagulation Profile: No results for input(s): INR, PROTIME in the last 168 hours. Cardiac Enzymes: No results for input(s): CKTOTAL, CKMB, CKMBINDEX, TROPONINI in the last 168 hours. BNP (last 3 results) No results for input(s): PROBNP in the last 8760 hours. HbA1C: No results for input(s): HGBA1C in the last 72 hours. CBG: No results for input(s): GLUCAP in the last 168 hours. Lipid Profile: Recent Labs    07/02/21 0636  CHOL 226*  HDL 36*  LDLCALC 161*  TRIG 147  CHOLHDL 6.3   Thyroid Function Tests: No results for input(s): TSH, T4TOTAL, FREET4, T3FREE, THYROIDAB in the last 72 hours. Anemia Panel: No results for input(s): VITAMINB12, FOLATE, FERRITIN, TIBC, IRON, RETICCTPCT in the last 72 hours. Sepsis Labs: No results for input(s): PROCALCITON, LATICACIDVEN in the last 168 hours.  Recent Results (from the past 240 hour(s))  Resp Panel by RT-PCR (Flu A&B, Covid) Nasopharyngeal Swab     Status: None   Collection Time: 07/01/21  3:45 AM   Specimen: Nasopharyngeal Swab; Nasopharyngeal(NP) swabs in  vial transport medium  Result Value Ref Range Status   SARS Coronavirus 2 by RT PCR NEGATIVE NEGATIVE Final    Comment: (NOTE) SARS-CoV-2 target nucleic acids are NOT DETECTED.  The SARS-CoV-2 RNA is generally detectable in upper respiratory specimens during the acute phase of infection. The lowest concentration of SARS-CoV-2 viral copies this assay can detect is 138 copies/mL. A negative result does not preclude SARS-Cov-2 infection and should not be used as the sole basis for treatment or other patient management decisions. A negative result may occur with  improper specimen collection/handling, submission of specimen other than nasopharyngeal swab, presence of viral mutation(s) within the areas targeted by this assay, and inadequate number of viral copies(<138 copies/mL). A negative result must be combined with clinical observations, patient history, and epidemiological information. The expected result is Negative.  Fact Sheet for Patients:  BloggerCourse.com  Fact Sheet for Healthcare Providers:  SeriousBroker.it  This test is no t yet approved or cleared by the [place] FDA and  has been authorized for detection and/or diagnosis of SARS-CoV-2 by FDA under an Emergency Use Authorization (EUA). This EUA will remain  in effect (meaning this test can be used) for the duration of the COVID-19 declaration under Section 564(b)(1) of the Act, 21 U.S.C.section 360bbb-3(b)(1), unless the authorization is terminated  or revoked sooner.       Influenza A by PCR NEGATIVE NEGATIVE Final   Influenza B by PCR NEGATIVE NEGATIVE Final    Comment: (NOTE) The Xpert Xpress SARS-CoV-2/FLU/RSV plus assay is intended as an aid in the diagnosis of influenza from Nasopharyngeal swab specimens and should not be used as a sole basis for treatment. Nasal washings and aspirates are unacceptable for Xpert Xpress SARS-CoV-2/FLU/RSV testing.  Fact  Sheet for Patients: BloggerCourse.com  Fact Sheet for Healthcare Providers: SeriousBroker.it  This test is not yet approved or cleared by the [place] FDA and has been authorized for detection and/or diagnosis of SARS-CoV-2 by FDA under an Emergency Use Authorization (EUA). This EUA will remain in effect (meaning this test can be used) for the duration of the COVID-19 declaration under Section 564(b)(1) of the Act, 21 U.S.C. section 360bbb-3(b)(1), unless the authorization is terminated or revoked.  Performed at [facility], [address] [place], [place] [address]      Radiology Studies: CT Head Wo Contrast  Result Date: 07/01/2021 CLINICAL DATA:  Initial evaluation for acute headache,  nausea, vomiting. EXAM: CT HEAD WITHOUT CONTRAST TECHNIQUE: Contiguous axial images were obtained from the base of the skull through the vertex without intravenous contrast. COMPARISON:  None available. FINDINGS: Brain: Cerebral volume within normal limits for age. There is heterogeneous patchy hypodensity involving the cortical and subcortical aspect of the bilateral parieto-occipital regions, left slightly greater than right. Overall, appearance is felt to be most consistent with PRES. No associated hemorrhage or significant regional mass effect. No other acute intracranial hemorrhage or large vessel territory infarct. No mass lesion or midline shift. No hydrocephalus or extra-axial fluid collection. Vascular: No hyperdense vessel. Skull: Scalp soft tissues and calvarium within normal limits. Sinuses/Orbits: Postoperative changes noted at the left globe. Prior ocular lens replacement at the right globe. Globes and orbital soft tissues demonstrate no acute finding. Paranasal sinuses and mastoid air cells are clear. Other: None. IMPRESSION: 1. Heterogeneous hypodensities involving the cortical and subcortical aspect of the bilateral  parieto-occipital regions, left slightly worse than right. Findings felt to be most consistent with PRES. No associated hemorrhage or significant regional mass effect. Further assessment with dedicated MRI suggested for further evaluation. 2. No other acute intracranial abnormality. Electronically Signed   By: Rise MuBenjamin  McClintock M.D.   On: 07/01/2021 02:37   MR Brain W and Wo Contrast  Result Date: 07/01/2021 CLINICAL DATA:  Headache, new or worsening. EXAM: MRI HEAD WITHOUT AND WITH CONTRAST TECHNIQUE: Multiplanar, multiecho pulse sequences of the brain and surrounding structures were obtained without and with intravenous contrast. CONTRAST:  10mL GADAVIST GADOBUTROL 1 MMOL/ML IV SOLN COMPARISON:  Same day CT head. FINDINGS: Brain: There is marked, fairly symmetric abnormal T2/FLAIR hyperintensity involving bilateral parieto-occipital lobes with associated restricted diffusion and areas of acute hemorrhage in the left occipital lobe. Additional patchy areas restricted diffusion and T2/FLAIR hyperintensity and bilateral frontal and parietal lobes and the white matter tracts of the brainstem, including the midbrain, dorsal pons, and medulla. There are patchy areas of enhancement, most conspicuous in the left occipital lobe. No hydrocephalus. No midline shift. No extra-axial fluid collection. Vascular: Major arterial flow voids are maintained at the skull base. Skull and upper cervical spine: Normal marrow signal. . Sinuses/Orbits: Postoperative changes of the left globe Other: No mastoid effusions. IMPRESSION: Severe/confluent abnormal T2/FLAIR hyperintense signal in bilateral parieto-occipital white matter and patchy abnormal signal in bilateral frontoparietal white matter and the white matter tracts of the brainstem, detailed above. Associated restricted diffusion in these areas is concerning for acute ischemia with patchy enhancement and acute hemorrhage in the left occipital lobe. 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 Other toxic/metabolic causes of leukoencephalopathy should be considered. PRES is thought less likely given sparing of the cortex. Tumefactive demyelination is also thought less likely. While MELAS (mitochondrial encephalopathy with lactic acidosis and stroke-like episodes) can have a similar appearance it typically occurs in childhood or early adulthood and also typically isn't this symmetric. Findings discussed with Dr. Ophelia CharterYates Via telephone at 7:09 PM. Electronically Signed   By: Feliberto HartsFrederick S Jones MD   On: 07/01/2021 19:15     LOS: 1 day   Lanae Boastamesh Shakim Faith, MD [facility]  07/02/2021, 9:26 AM

## 2021-07-02 NOTE — Progress Notes (Signed)
Pt up to bedside commode feeling the urge to urinate. Pt voided less than <50 ml. Bladder scanned pt and found 625 ml in bladder. In and out cath performed as ordered via sterile technique with Donnald Garre, RN present with nurse. Pt had a total of 850 ml out. Checked pt for residual and found to have 98 ml.

## 2021-07-02 NOTE — Progress Notes (Addendum)
Subjective: No acute events overnight.  States her nausea/vomiting is better. Continues to have headache.  ROS: negative except above  Examination  Vital signs in last 24 hours: Temp:  [97.8 F (36.6 C)-98.7 F (37.1 C)] 98 F (36.7 C) (07/28 0800) Pulse Rate:  [80-85] 83 (07/28 0800) Resp:  [15-28] 17 (07/28 0800) BP: (108-158)/(74-109) 158/95 (07/28 0800) SpO2:  [97 %-99 %] 97 % (07/28 0303) Weight:  [105.6 kg] 105.6 kg (07/27 1155)  General: lying in bed, NAD CVS: pulse-normal rate and rhythm RS: breathing comfortably, CTAB Extremities: normal, warm Neuro: : Aox3, legally blind, rest of the CN 2-12 grossly intact, 5/5 in all extremities, sensory intact to light touch    Basic Metabolic Panel: Recent Labs  Lab 07/01/21 0100  NA 138  K 3.6  CL 103  CO2 22  GLUCOSE 148*  BUN 10  CREATININE 0.76  CALCIUM 9.8    CBC: Recent Labs  Lab 07/01/21 0100  WBC 6.6  NEUTROABS 4.5  HGB 14.0  HCT 41.3  MCV 91.2  PLT 371     Coagulation Studies: No results for input(s): LABPROT, INR in the last 72 hours.  Imaging  MRI Brain w and wo contrast 07/01/2021: Severe/confluent abnormal T2/FLAIR hyperintense signal in bilateral parieto-occipital white matter and patchy abnormal signal in bilateral frontoparietal white matter and the white matter tracts of the brainstem, detailed above. Associated restricted diffusion in these areas is concerning for acute ischemia with patchy enhancement and acute hemorrhage in the left occipital lobe. 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 Other toxic/metabolic causes of leukoencephalopathy should be considered. PRES is thought less likely given sparing of the cortex. Tumefactive demyelination is also thought less likely. While MELAS (mitochondrial encephalopathy with lactic acidosis and stroke-like  episodes) can have a similar appearance it typically occurs in childhood or early adulthood and also typically isn't this symmetric.    ASSESSMENT AND PLAN:  28 old female who presented with headache, nausea and vomiting.  CT head was performed concerning for PRES.   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  He states the diagnosis of lupus is unclear.  However, patient has history of waxing and waning symptoms and with current MRI changes it does appear that patient most likely has some autoimmune process.  We will   Recommendations: -Plan to obtain lumbar puncture today.  Patient was on Xarelto has been held since yesterday  - Hold azathioprine and prednisone for now, continue rest of the home meds -We will also obtain CTA head to assess for vasculitis.  Discussed case with Dr. Pilar Plate at [facility] as well, no additional work-up recommended. - Goal normotension - PRN zofran for N/V - Management of rest of the comorbidities per primary team -I have discussed the case with rheumatology and neurology at [facility].  Patient is on the wait list for transfer to medicine service at [facility].  CRITICAL CARE Performed by: Charlsie Quest   Total critical care time: 45 minutes  Critical care time was exclusive of separately billable procedures and treating other patients.  Critical care was necessary to treat or prevent imminent or life-threatening deterioration.  Critical care was time spent personally by me on the following activities: development of treatment plan with  patient and/or surrogate as well as nursing, discussions with consultants, evaluation of patient's response to treatment, examination of patient, obtaining history  from patient or surrogate, ordering and performing treatments and interventions, ordering and review of laboratory studies, ordering and review of radiographic studies, pulse oximetry and re-evaluation of patient's condition.   Lindie Spruce Epilepsy [facility] For questions after 5pm please refer to AMION to reach the Neurologist on call

## 2021-07-02 NOTE — Progress Notes (Signed)
HOSPITAL MEDICINE OVERNIGHT EVENT NOTE    MRI brain with and without contrast this evening revealing abnormal T2/FLAIR hyperintense signal in bilateral parieto-occipital white matter and patchy abnormal signal in the bilateral frontoparietal white matter.  No evidence of associated restricted diffusion in these areas is concerning for an acute ischemic process.  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  Another possibility could be lymphoma.  Finally there is concern for possible small area of acute hemorrhage in the left occipital lobe.  Case discussed with Dr. Wilford Corner with neurology who has additionally written a progress note earlier in the evening.  He is recommending holding the patient's Xarelto as patient will need a lumbar puncture 48 hours after cessation to obtain flow cytometry and cytology.  He is additionally recommending holding steroids for now until diagnosis is more firmly established.  Finally he is also recommending tight blood pressure control with target systolic blood pressures less than 140 due to concerns for possible small hemorrhage in the left occipital lobe.    Ultimately, patient would likely benefit from transfer to a tertiary care center for availability of inpatient ophthalmological and rheumatological care per my discussion with neurology although this is not emergent and therefore this will be pursued tomorrow morning.  In the meantime we will continue to monitor with serial neurologic checks and tight blood pressure control as detailed above  Shelley Elk  MD [facility]

## 2021-07-02 NOTE — Progress Notes (Signed)
Pt currently off unit to have order CTA of head.

## 2021-07-02 NOTE — Progress Notes (Signed)
Went to place foley on patient, Shelley Reese requested that we wait as she just received flomax "not too long ago".  Updated primary nurse Rey.  Supplies at bedside.

## 2021-07-02 NOTE — Evaluation (Signed)
Occupational Therapy Evaluation Patient Details Name: Shelley Reese MRN: [id number] DOB: Feb 12, 1966 Today's Date: 07/02/2021    History of Present Illness Pt is a 55 yr old female who reported headache and n/v. It was reported in notes with acute severe leukoencephalopathy and possible acute hemorrhage in the left occipital lobe. PMH but not limited to: HPI: Shelley Reese is a 55 y.o. female with medical history significant of lupus with vasculitis/uveitis; visual impairment; and remote DVT   Clinical Impression   Pt reports at PLOF lives with daughter in an open concept home. Pt at this time agreed to session but starting to move around at bed level started to feel sick and needed to eat. Pt required set up of all meal and use clock method with hand over hand to guide pt to items at this time. Will follow pt to further assess mobility with ADLS.  Pt currently with functional limitations due to the deficits listed below (see OT Problem List).  Pt will benefit from skilled OT to increase their safety and independence with ADL and functional mobility for ADL to facilitate discharge to venue listed below.      Follow Up Recommendations  Other (comment) (pt decline out of bed activity at this time as then wanted to eat as started to feel sick, will follow to update)    Equipment Recommendations       Recommendations for Other Services       Precautions / Restrictions Precautions Precautions: Other (comment) (vision)      Mobility Bed Mobility Overal bed mobility: Needs Assistance Bed Mobility: Rolling Rolling: Modified independent (Device/Increase time)         General bed mobility comments: HOB elevated and cues on hand placement due to vision    Transfers                 General transfer comment: pt decline out of bed activity and reported they need to eat first prior then any further movement at this time    Balance                                            ADL either performed or assessed with clinical judgement   ADL Overall ADL's : Needs assistance/impaired Eating/Feeding: Set up;Sitting;Bed level   Grooming: Wash/dry hands;Wash/dry face;Set up;Sitting;Bed level   Upper Body Bathing: Cueing for sequencing;Sitting;Min guard                                   Vision Patient Visual Report: Other (comment) (pt reproted now they can not see anything but at PLOF could see some contrast and dark to light)       Perception     Praxis      Pertinent Vitals/Pain       Hand Dominance Right   Extremity/Trunk Assessment Upper Extremity Assessment Upper Extremity Assessment: Overall WFL for tasks assessed   Lower Extremity Assessment Lower Extremity Assessment: Defer to PT evaluation   Cervical / Trunk Assessment Cervical / Trunk Assessment: Normal   Communication Communication Communication: No difficulties   Cognition Arousal/Alertness: Awake/alert Behavior During Therapy: WFL for tasks assessed/performed Overall Cognitive Status: Within Functional Limits for tasks assessed  General Comments       Exercises     Shoulder Instructions      Home Living Family/patient expects to be discharged to:: Private residence Living Arrangements: Children (daugher) Available Help at Discharge: Family Type of Home: House Home Access: Stairs to enter Secretary/administrator of Steps: 3 Entrance Stairs-Rails:  (pt reproted they had rail to hold onto enter the house) Home Layout: One level     Bathroom Shower/Tub: Producer, television/film/video: Standard Bathroom Accessibility: Yes   Home Equipment: Grab bars - tub/shower (pt reported getting a shower seat)          Prior Functioning/Environment Level of Independence: Independent with assistive device(s)        Comments: modified due to blurry vision        OT Problem List: Impaired balance  (sitting and/or standing);Impaired vision/perception;Decreased safety awareness;Decreased knowledge of use of DME or AE;Decreased knowledge of precautions      OT Treatment/Interventions: Self-care/ADL training;Therapeutic exercise;DME and/or AE instruction;Therapeutic activities;Visual/perceptual remediation/compensation;Patient/family education;Balance training    OT Goals(Current goals can be found in the care plan section) Acute Rehab OT Goals Patient Stated Goal: to go home OT Goal Formulation: With patient Time For Goal Achievement: 07/11/21 Potential to Achieve Goals: Good ADL Goals Pt Will Perform Upper Body Bathing: with modified independence;sitting Pt Will Perform Lower Body Bathing: with modified independence;sit to/from stand Pt Will Transfer to Toilet: with modified independence;ambulating  OT Frequency: Min 2X/week   Barriers to D/C:            Co-evaluation              AM-PAC OT "6 Clicks" Daily Activity     Outcome Measure Help from another person eating meals?: A Little Help from another person taking care of personal grooming?: A Little Help from another person toileting, which includes using toliet, bedpan, or urinal?: A Little Help from another person bathing (including washing, rinsing, drying)?: A Little Help from another person to put on and taking off regular upper body clothing?: A Little Help from another person to put on and taking off regular lower body clothing?: A Little 6 Click Score: 18   End of Session Nurse Communication: Other (comment) (needs assist for set up of meals)  Activity Tolerance: Other (comment) (pt reproted they needed to eat prior to further activity as reports feeling sick) Patient left: in bed;with call bell/phone within reach;with bed alarm set  OT Visit Diagnosis: Unsteadiness on feet (R26.81);Other abnormalities of gait and mobility (R26.89);Low vision, both eyes (H54.2)                Time: 8119-1478 OT Time  Calculation (min): 26 min Charges:  OT General Charges $OT Visit: 1 Visit OT Evaluation $OT Eval Low Complexity: 1 Low OT Treatments $Self Care/Home Management : 8-22 mins  Alphia Moh OTR/L  Acute Rehab Services  [phone] office number [phone] pager number   Alphia Moh 07/02/2021, 9:52 AM

## 2021-07-02 NOTE — Progress Notes (Signed)
   07/01/21 2200  Neurological  Neuro (WDL) X  Orientation Level Oriented to person;Oriented to place;Oriented to situation  Cognition Appropriate at baseline  Speech Clear  R Pupil Size (mm) 4  R Pupil Reaction Brisk  L Pupil Size (mm) 3  L Pupil Reaction Nonreactive  Facial Symmetry Symmetrical  R Hand Grip Strong  L Hand Grip Strong  RUE Motor Response Purposeful movement  RUE Sensation Full sensation  RUE Motor Strength 5  LUE Motor Response Purposeful movement  LUE Sensation Full sensation  LUE Motor Strength 5  RLE Motor Response Purposeful movement  RLE Sensation Full sensation  RLE Motor Strength 4  LLE Motor Response Purposeful movement  LLE Sensation Full sensation  LLE Motor Strength 4  Glasgow Coma Scale  Eye Opening 4  Best Verbal Response (NON-intubated) 5  Best Motor Response 6  Glasgow Coma Scale Score 15  Neurological  Level of Consciousness Alert   Patient now awake & alert. Disoriented to time d/t blindness, reoriented. She is following commands appropriately but has difficulty ambulating d/t BLE weakness. Her balance/ gait still seems off, so she is assisted by staff to bedside commode using walker. She was unable to urinate and bladder scan showed ~476mL. I/O cath had out. UA sent.

## 2021-07-02 NOTE — Progress Notes (Signed)
Initial Nutrition Assessment  DOCUMENTATION CODES:  Obesity unspecified  INTERVENTION:  Add [employer] 1.4 po BID, each supplement provides 455 kcal and 20 grams of protein.   Add MVI with minerals daily.  NUTRITION DIAGNOSIS:  Inadequate oral intake related to nausea, vomiting as evidenced by per patient/family report.  GOAL:  Patient will meet greater than or equal to 90% of their needs  MONITOR:  PO intake, Supplement acceptance, Labs, Weight trends, I & O's  REASON FOR ASSESSMENT:  Consult Assessment of nutrition requirement/status  ASSESSMENT:  55 yo female with a PMH of lupus with vasculitis/uveitis, visual impairment, remote DVT history who presents with posterior reversible encephalopathy syndrome.  Spoke with pt at bedside. Pt reports that her and her daughter recently converted to a mainly Med diet lifestyle and have cut out "bad foods" such as most meats and fatty foods. Her sister is trying to convert them to veganism.  She reports that she has lost this weight recently with her diet change and with being sick for the past week.   No recent history in Epic. Per Care Everywhere, pt weighed 112.1 kg on 04/17/21 and on admission, weighed 105.6 kg. This is a ~14 lb (5.6%) wt loss in 2.5 months, which is significant and severe for the time frame.  On exam, pt has no significant depletions.  Recommend adding Molli Posey 1.4 BID and MVI with minerals daily.  Medications: reviewed; Protonix, NaCl @ 50 ml/hr via IV  Labs: reviewed; Glucose 148 (H)  NUTRITION - FOCUSED PHYSICAL EXAM: Flowsheet Row Most Recent Value  Orbital Region No depletion  Upper Arm Region No depletion  Thoracic and Lumbar Region No depletion  Buccal Region No depletion  Temple Region No depletion  Clavicle Bone Region No depletion  Clavicle and Acromion Bone Region No depletion  Scapular Bone Region No depletion  Dorsal Hand No depletion  Patellar Region No depletion  Anterior Thigh Region No  depletion  Posterior Calf Region No depletion  Edema (RD Assessment) None  Hair Reviewed  Eyes Reviewed  Mouth Reviewed  Skin Reviewed  Nails Reviewed   Diet Order:   Diet Order             Diet Heart Room service appropriate? Yes; Fluid consistency: Thin  Diet effective now                  EDUCATION NEEDS:  Education needs have been addressed  Skin:  Skin Assessment: Reviewed RN Assessment  Last BM:  PTA/unknown  Height:  Ht Readings from Last 1 Encounters:  07/01/21 5\' 9"  (1.753 m)   Weight:  Wt Readings from Last 1 Encounters:  07/01/21 105.6 kg   BMI:  Body mass index is 34.39 kg/m.  Estimated Nutritional Needs:  Kcal:  1850-2050 Protein:  95-110 grams Fluid:  >1.85 L  07/03/21, RD, LDN (she/her/hers) Registered Dietitian I After-Hours/Weekend Pager # in [place]

## 2021-07-02 NOTE — Progress Notes (Signed)
OT Cancellation Note  Patient Details Name: Shelley Reese MRN: [id number] DOB: July 02, 1966   Cancelled Treatment:    Reason Eval/Treat Not Completed: Other (comment) (Pt reported they did not want to complete therapy at this time as they recieved some bad news to assess out of bed activity.)  West Pugh OTR/L  Acute Rehab Services  [phone] office number [phone] pager number  07/02/2021, 11:30 AM

## 2021-07-02 NOTE — Evaluation (Signed)
Physical Therapy Evaluation Patient Details Name: Shelley Reese MRN: [id number] DOB: Feb 13, 1966 Today's Date: 07/02/2021   History of Present Illness  Pt presented 7/27  who presents with a week of headache with N/V. 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  Clinical Impression  Pt presents to PT with significant decline in mobility due to weakness, decr balance, and limited vision. Prior to admit pt functional at home. Pt with possible transfer to another facility. If remains here may need to consider CIR prior to return home.     Follow Up Recommendations CIR    Equipment Recommendations  Rolling walker with 5" wheels    Recommendations for Other Services       Precautions / Restrictions Precautions Precautions: Fall;Other (comment) Precaution Comments: blind      Mobility  Bed Mobility               General bed mobility comments: Pt up on EOB    Transfers Overall transfer level: Needs assistance Equipment used: Rolling walker (2 wheeled) Transfers: Sit to/from Stand Sit to Stand: Min assist         General transfer comment: Assist to bring hips up and for balance. Verbal cues for hand placement  Ambulation/Gait Ambulation/Gait assistance: Min assist;+2 safety/equipment Gait Distance (Feet): 12 Feet (x 2) Assistive device: Rolling walker (2 wheeled) Gait Pattern/deviations: Step-through pattern;Decreased step length - right;Decreased step length - left;Shuffle;Trunk flexed Gait velocity: eecr Gait velocity interpretation: <1.31 ft/sec, indicative of household ambulator General Gait Details: Assist for balance and support as well as verbal/tactile cues for direction and to manage walker due to decr vision  Stairs            Wheelchair Mobility    Modified Rankin  (Stroke Patients Only)       Balance Overall balance assessment: Needs assistance Sitting-balance support: No upper extremity supported;Feet supported Sitting balance-Leahy Scale: Good     Standing balance support: Single extremity supported;Bilateral upper extremity supported;During functional activity Standing balance-Leahy Scale: Poor Standing balance comment: walker and min guard for static standing. Pt with multiple losses of balance requiring min assist to correct when trying to pull up her pants after using commode.                             Pertinent Vitals/Pain Pain Assessment: Faces Faces Pain Scale: No hurt    Home Living Family/patient expects to be discharged to:: Private residence Living Arrangements: Children (17 y.o. daugher) Available Help at Discharge: Family Type of Home: House Home Access: Stairs to enter Entrance Stairs-Rails:  (pt reproted they had rail to hold onto enter the house) Secretary/administrator of Steps: 3 Home Layout: One level Home Equipment: Grab bars - tub/shower (pt reported getting a shower seat)      Prior Function Level of Independence: Independent with assistive device(s)         Comments: modified due to blurry vision     Hand Dominance   Dominant Hand: Right    Extremity/Trunk Assessment   Upper Extremity Assessment Upper Extremity Assessment: Defer to OT evaluation    Lower Extremity Assessment Lower Extremity Assessment: Generalized weakness    Cervical / Trunk Assessment Cervical / Trunk Assessment: Normal  Communication   Communication: No difficulties  Cognition Arousal/Alertness: Awake/alert Behavior During  Therapy: WFL for tasks assessed/performed Overall Cognitive Status: Impaired/Different from baseline Area of Impairment: Problem solving;Safety/judgement;Following commands                       Following Commands: Follows one step commands with increased time Safety/Judgement:  Decreased awareness of deficits   Problem Solving: Requires verbal cues;Slow processing;Difficulty sequencing        General Comments      Exercises     Assessment/Plan    PT Assessment Patient needs continued PT services  PT Problem List Decreased strength;Decreased activity tolerance;Decreased balance;Decreased mobility;Decreased cognition       PT Treatment Interventions DME instruction;Gait training;Stair training;Functional mobility training;Therapeutic activities;Therapeutic exercise;Balance training;Patient/family education    PT Goals (Current goals can be found in the Care Plan section)  Acute Rehab PT Goals Patient Stated Goal: to go home PT Goal Formulation: With patient Time For Goal Achievement: 07/16/21 Potential to Achieve Goals: Good    Frequency Min 3X/week   Barriers to discharge Inaccessible home environment stairs to enter    Co-evaluation               AM-PAC PT "6 Clicks" Mobility  Outcome Measure Help needed turning from your back to your side while in a flat bed without using bedrails?: None Help needed moving from lying on your back to sitting on the side of a flat bed without using bedrails?: A Little Help needed moving to and from a bed to a chair (including a wheelchair)?: A Little Help needed standing up from a chair using your arms (e.g., wheelchair or bedside chair)?: A Little Help needed to walk in hospital room?: A Little Help needed climbing 3-5 steps with a railing? : A Lot 6 Click Score: 18    End of Session Equipment Utilized During Treatment: Gait belt Activity Tolerance: Patient limited by fatigue Patient left: in chair;with call bell/phone within reach;with chair alarm set   PT Visit Diagnosis: Other abnormalities of gait and mobility (R26.89);Muscle weakness (generalized) (M62.81);Unsteadiness on feet (R26.81)    Time: 3762-8315 PT Time Calculation (min) (ACUTE ONLY): 32 min   Charges:   PT Evaluation $PT Eval  Moderate Complexity: 1 Mod PT Treatments $Gait Training: 8-22 mins        [facility] PT Acute Rehabilitation Services Pager [phone] Office [phone]   Angelina Ok [facility] 07/02/2021, 2:57 PM

## 2021-07-02 NOTE — Evaluation (Signed)
Speech Language Pathology Evaluation Patient Details Name: Shelley Reese MRN: [id number] DOB: 09-Dec-1965 Today's Date: 07/02/2021 Time: 8937-3428 SLP Time Calculation (min) (ACUTE ONLY): 24 min  Problem List:  Patient Active Problem List   Diagnosis Date Noted   Posterior reversible encephalopathy syndrome 07/01/2021   Lupus (systemic lupus erythematosus) (HCC) 07/01/2021   Visual impairment severe bilaterally 07/01/2021   Class 1 obesity due to excess calories with body mass index (BMI) of 34.0 to 34.9 in adult 07/01/2021   Achalasia s/p laparoscopic Heller myotomy an Dor fundoplication 06/23/17 06/23/2017   Cough 03/09/2017   Pulmonary infiltrates 02/13/2017   Acute deep vein thrombosis (DVT) of ulnar vein of left upper extremity (HCC) 01/10/2017   Hoarseness of voice 01/10/2017   Cough variant asthma vs UACS 11/27/2011   Past Medical History:  Past Medical History:  Diagnosis Date   Acute deep vein thrombosis (DVT) of ulnar vein of left upper extremity (HCC) 01/10/2017   11/04/16 all veins from basilic/cephalic up through internal jugular; unprovoked   Asthma    Complication of anesthesia    following colonoscopy; spiked BP but was likely related to size of cuff; " one they gave me a bigger cuff it went right down "   GERD (gastroesophageal reflux disease)    Hoarseness of voice 01/10/2017   X1 year; reported at visit 01/10/17; nodules on voacal chords    Lupus (HCC)    Visual impairment    retinal vasculiits/uveitis   Past Surgical History:  Past Surgical History:  Procedure Laterality Date   ESOPHAGEAL MANOMETRY N/A 03/09/2017   Procedure: ESOPHAGEAL MANOMETRY (EM);  Surgeon: Charna Elizabeth, MD;  Location: [facility];  Service: Endoscopy;  Laterality: N/A;   HELLER MYOTOMY N/A 06/23/2017   Procedure: LAPAROSCOPIC HELLER MYOTOMY AND DOR FUNDOPLICATION;  Surgeon: Avel Peace, MD;  Location: [facility];  Service: General;  Laterality: N/A;   VAGINAL DELIVERY     HPI:  Pt is a  55 yo female with h/o lupus for which she started chemotherapy, who presents with a week of headache with N/V. 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 Throughout all testing she exhibited difficulties with working and short-term memory, and was easily distracted both internally and by her environment. Pt's problem solving is also impacted both verbally and functionally, with pt needing repetitions to process information. Given this acute change, recommend ongoing SLP f/u as she undergoes additional medical w/u. Based on current presentation would recommend SLP f/u and 24/7 supervision at next level of care as well.    SLP Assessment  SLP Recommendation/Assessment: Patient needs continued Speech Lanaguage Pathology Services SLP Visit Diagnosis: Cognitive communication deficit (R41.841)    Follow Up Recommendations  24 hour supervision/assistance;Other (comment) (SLP f/u at next level of care)    Frequency and Duration min 2x/week  2 weeks      SLP Evaluation Cognition  Overall Cognitive Status: Impaired/Different from baseline (Simultaneous filing. User may not have seen previous data.) Arousal/Alertness: Awake/alert Orientation Level: Oriented X4 Attention: Selective Selective Attention:  Impaired Selective Attention Impairment: Verbal basic;Functional basic Memory: Impaired Memory Impairment: Decreased recall of new information Problem  Solving: Impaired Problem Solving Impairment: Functional basic;Verbal basic       Comprehension  Auditory Comprehension Overall Auditory Comprehension: Appears within functional limits for tasks assessed    Expression Expression Primary Mode of Expression: Verbal Verbal Expression Overall Verbal Expression: Appears within functional limits for tasks assessed Written Expression Dominant Hand: Right   Oral / Motor  Motor Speech Overall Motor Speech: Appears within functional limits for tasks assessed   GO                    Mahala Menghini., M.A. CCC-SLP Acute Rehabilitation Services Pager [phone] Office [phone]  07/02/2021, 9:55 AM

## 2021-07-02 NOTE — Progress Notes (Signed)
Rehab Admissions Coordinator Note:  Patient was screened by Clois Dupes for appropriateness for an Inpatient Acute Rehab Consult per therapy recommendations. Noted on Wait list for transfer to [facility]. I will hold on rehab consult and follow at a distance.Clois Dupes RN MSN 07/02/2021, 3:55 PM  I can be reached at [phone].

## 2021-07-02 NOTE — Procedures (Signed)
LUMBAR PUNCTURE (SPINAL TAP) PROCEDURE NOTE  Indication: leucoencephalopathy   Proceduralists: Dr Lindie Spruce  Time of procedure: 07/02/2021 1505   Risks of the procedure were dicussed with the patient including post-LP headache, bleeding, infection, weakness/numbness of legs(radiculopathy), death.    Consent obtained from: patient    Procedure Note The patient was prepped and draped, and using sterile technique a 20 gauge quinke spinal needle was inserted in the L4-5 space.   Opening pressure was not obtained Approximately 20 cc of CSF were obtained and sent for analysis.  Patient tolerated the procedure well and blood loss was minimal.

## 2021-07-03 ENCOUNTER — Inpatient Hospital Stay (HOSPITAL_COMMUNITY): Payer: BC Managed Care – PPO

## 2021-07-03 LAB — MISC LABCORP TEST (SEND OUT)

## 2021-07-03 LAB — HIGH SENSITIVITY CRP: CRP, High Sensitivity: 3.29 mg/L — ABNORMAL HIGH (ref 0.00–3.00)

## 2021-07-03 LAB — ANA: Anti Nuclear Antibody (ANA): NEGATIVE

## 2021-07-03 LAB — C4 COMPLEMENT: Complement C4, Body Fluid: 17 mg/dL (ref 12–38)

## 2021-07-03 LAB — ANTI-DNA ANTIBODY, DOUBLE-STRANDED: ds DNA Ab: <1 IU/mL (ref 0–9)

## 2021-07-03 LAB — C3 COMPLEMENT: C3 Complement: 126 mg/dL (ref 82–167)

## 2021-07-03 LAB — HEMOGLOBIN A1C
Hgb A1c MFr Bld: 5.1 % (ref 4.8–5.6)
Mean Plasma Glucose: 100 mg/dL

## 2021-07-03 LAB — CYTOLOGY - NON PAP

## 2021-07-03 MED ORDER — PREDNISONE 10 MG PO TABS
10.0000 mg | ORAL_TABLET | Freq: Every day | ORAL | Status: DC
  Administered 2021-07-03: 10 mg via ORAL
  Filled 2021-07-03: qty 1

## 2021-07-03 MED ORDER — LORAZEPAM 1 MG PO TABS
2.0000 mg | ORAL_TABLET | Freq: Once | ORAL | Status: AC
  Administered 2021-07-03: 2 mg via ORAL
  Filled 2021-07-03: qty 2

## 2021-07-03 MED ORDER — RIVAROXABAN 20 MG PO TABS
20.0000 mg | ORAL_TABLET | Freq: Every day | ORAL | Status: DC
  Administered 2021-07-03: 20 mg via ORAL
  Filled 2021-07-03: qty 1

## 2021-07-03 NOTE — Progress Notes (Addendum)
Subjective: NAEO States she wasn't able to sleep all night because someone came to her room without knocking and she was anxious to fall back asleep. Has been able to urinate now.   ROS: negative except above  Examination  Vital signs in last 24 hours: Temp:  [97.9 F (36.6 C)-98.3 F (36.8 C)] 98 F (36.7 C) (07/29 0756) Pulse Rate:  [80-94] 90 (07/29 0756) Resp:  [17-18] 18 (07/29 0756) BP: (133-153)/(86-119) 153/95 (07/29 0756) SpO2:  [99 %-100 %] 99 % (07/29 0756)  General: lying in bed, NAD CVS: pulse-normal rate and rhythm RS: breathing comfortably, CTAB Extremities: normal, warm Neuro: : Aox3, legally blind, rest of the CN 2-12 grossly intact, 5/5 in all extremities, sensory intact to light touch    Basic Metabolic Panel: Recent Labs  Lab 07/01/21 0100  NA 138  K 3.6  CL 103  CO2 22  GLUCOSE 148*  BUN 10  CREATININE 0.76  CALCIUM 9.8    CBC: Recent Labs  Lab 07/01/21 0100  WBC 6.6  NEUTROABS 4.5  HGB 14.0  HCT 41.3  MCV 91.2  PLT 371     Coagulation Studies: No results for input(s): LABPROT, INR in the last 72 hours.  Imaging CTA head 07/02/2021: No convincing CTA evidence for CNS vasculitis.  Mild atheromatous change about the carotid siphons without hemodynamically significant stenosis. Otherwise wide patency of the major arterial intracranial circulation.  ASSESSMENT AND PLAN: 39 old female who presented with headache, nausea and vomiting.  CT head was performed concerning for PRES.   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 CRP 3.29, Normal ESR, C3, C4, ANA, Anti Ds-DNA,    Recommendations: - Pending labs: CSF culture, cytology, ACE, Myelin basic protein, oligoclonal bands, IgG , autoimmune CSF and serum panel (  sendout to [facility]).  - Hold azathioprine for now, continue rest of the home meds including prednisone and Xarelto - Will consider high dose solumedrol if patient has worsening of symptoms, new focal neurological deficits.  - Discussed case with Dr. Dorris Fetch at [facility] again today, awaiting transfer. Agree to hold off steroids for now as she might need angio and/or biopsy as part of further workup - Goal normotension - PRN tylenon, ibuprofen for headache - Management of rest of the comorbidities per primary team -I have discussed the case with rheumatology and neurology at [facility]  Patient is on the wait list for transfer to neurology service at [facility].  I have spent a total of 25  minutes with the patient reviewing hospital notes,  test results, labs and examining the patient as well as establishing an assessment and plan that was discussed personally with the patient, daughter at bedside and sister tara on phone  > 50% of time was spent in direct patient care.   Zeb Comfort Epilepsy [facility] For questions after 5pm please refer to AMION to reach the Neurologist on call

## 2021-07-03 NOTE — Progress Notes (Signed)
Pt left unit to have CTA of Head completed.   0210- Pt arrived back on unit. Pt without complaints connected back to monitor.

## 2021-07-03 NOTE — Progress Notes (Signed)
PROGRESS NOTE    Shelley Reese  UQJ:[id number] DOB: 12-09-1965 DOA: 07/01/2021 PCP: Darrow Bussing, MD   Chief Complaint  Patient presents with   Nausea   Emesis    Brief Narrative: 55 year old female with history of lupus with vasculitis/uveitis, visual impairment, remote DVT history on Xarelto presents with a headache, nausea, vomiting and weakness.  She had been "sick" week prior during a trip to [facility] with nausea and vomiting and on last Thursday started with a "raging" migraine.  She was seen in urgent care and ED CT head and sent to the ED.  In the ED blood pressure elevated in 170s CT head + features concerning for PR ES ED consult with neurology and underwent MRI brain holding azathioprine and Xarelto.  MRI brain with and without with confluent T2/FL AIR hyperintense lesions in the right occipital white matter, patchy abnormal signal in the bilateral frontotemporal white matter and white matter tracts of the brainstem, associated restricted diffusion in this area concerning for acute ischemia with patchy enhancement and possible acute hemorrhage in the left occipital lobe not seen very well on the susceptibility images as per neurology this findings compatible with acute severe leukoencephalopathy, which is nonspecific but given history of lupus could be secondary to lupus cerebritis/vasculitis other differential remains broad including PR ES and given family history of lymphoma remains the differential and immunosuppressed related leukoencephalopathy  Per neurology Xarelto was held as she will need spinal tap large-volume CSF with flow cytometry, cytology 48 hours from Xarelto, no steroids for now. Also suggested transfer to [facility] where patient receives her care given rheumatology neurology and other specialities availability there. Dr. Wilford Corner discussed w/ Dr.  Ophelia Charter admitting MD- for transfer to [facility] but patient was admitted as there was no bed available. While waiting for  transfer patient will continue to undergo work-up with LP, CT angio.  Subjective: Reports her headache is much improved this morning.  Daughter at the bedside.  No other new complaints.  She would like to go home.  Assessment & Plan:  Leukoencephalopathy  Headache-better Nausea vomiting (resolved)  MRI brain showing features of severe leukoencephalopathy differential includes- lupus cerebritis associated, POTS, lymphoma (given family history of lymphoma), and also immunosuppressant related leukoencephalopathy.Held Xarelto underwent lumbar puncture 7/28 CSF work-up in process. CT angio head no acute findings.Discussed Dr. Melynda Ripple resuming prednisone and Xarelto today.Attempting to transfer to [facility]- Where they have her rheumatology service she has been accepted/is in list pending bed.  History of lupus uveitis/lesion. Lupus Holding azathioprine. Back on PO and eye drop prednisone Hx of DVT- resumed xarelto Morbid obesity with BMI 34: OP pcp f/u wt loss strategy  Waiting for transfer to [facility] hospital: Discussed w/ Dr. Melynda Ripple this morning.  Patient voices that she would like to follow-up with [facility] outpatient-await neuro inputs.  Diet Order             Diet Heart Room service appropriate? Yes; Fluid consistency: Thin  Diet effective now                 Patient's Body mass index is 34.39 kg/m. DVT prophylaxis: scd Code Status:   Code Status: Full Code  Family Communication: plan of care discussed with patient at bedside. Status is: Inpatient Remains inpatient appropriate because:Ongoing diagnostic testing needed not appropriate for outpatient work up and Inpatient level of care appropriate due to severity of illness Dispo: The patient is from: Home  Anticipated d/c is to:  TBD              Patient currently is not medically stable to d/c.   Difficult to place patient No Unresulted Labs (From admission, onward)     Start     Ordered    07/02/21 1547  Draw extra clot tube  (Oligoclonal Bands, CSF + Serum panel)  Once,   R       Question:  Specimen collection method  Answer:  Lab=Lab collect   07/02/21 1546   07/02/21 1547  IgG CSF index  (IgG CSF Index, CSF + Serum panel)  Once,   R        07/02/21 1546   07/02/21 1547  Draw extra clot tube  (IgG CSF Index, CSF + Serum panel)  Once,   R       Question:  Specimen collection method  Answer:  Lab=Lab collect   07/02/21 1546   07/02/21 1547  Angiotensin converting enzyme, CSF  Once,   R        07/02/21 1546   07/02/21 1547  Myelin basic protein, CSF  Once,   R        07/02/21 1546   07/02/21 1546  Oligoclonal bands, CSF + serum  (CSF Labs)  Once,   R        07/02/21 1546   07/02/21 1546  Draw extra clot tube  (CSF Labs)  Once,   R       Question:  Specimen collection method  Answer:  Lab=Lab collect   07/02/21 1546   07/02/21 1545  Miscellaneous [facility] test (send-out)  Once,   R       Comments: Send out to [facility]   Question:  Test name / description:  Answer:  Encephalopathy, Autoimmune/Paraneoplastic Evaluation, Spinal Fluid Sherrilee Gilles)   07/02/21 1546   07/02/21 1544  Miscellaneous [facility] test (send-out)  Once,   R       Comments: Send out to [facility]   Question:  Test name / description:  Answer:  Encephalopathy, Autoimmune/Paraneoplastic Evaluation, Serum, ENS2   07/02/21 1546   07/01/21 1431  Urine rapid drug screen (hosp performed)not at [facility])  Once,   R        07/01/21 1432           Medications reviewed:  Scheduled Meds:  dorzolamide-timolol  1 drop Left Eye BID   feeding supplement (KATE FARMS STANDARD 1.4)  325 mL Oral BID BM   multivitamin with minerals  1 tablet Oral Daily   pantoprazole  40 mg Oral Daily   prednisoLONE acetate  1 drop Right Eye Q4H while awake   predniSONE  10 mg Oral Q breakfast   rivaroxaban  20 mg Oral Q breakfast   tamsulosin  0.4 mg Oral QPC supper   Continuous Infusions:  sodium chloride 50 mL/hr at 07/02/21  2207   Consultants:see note  Procedures:see note Antimicrobials: Anti-infectives (From admission, onward)    None      Culture/Microbiology    Component Value Date/Time   SDES CSF 07/02/2021 1546   SDES CSF 07/02/2021 1546   SPECREQUEST Immunocompromised 07/02/2021 1546   SPECREQUEST Immunocompromised 07/02/2021 1546   CULT  07/02/2021 1546    NO GROWTH < 24 HOURS Performed at [facility], [address] [place], [place] [address]    CULT  07/02/2021 1546    NO GROWTH < 24 HOURS Performed at [facility], [address] [place], [place] [address]    REPTSTATUS PENDING 07/02/2021 1546   REPTSTATUS PENDING 07/02/2021 1546    Other culture-see note  Objective: Vitals: Today's Vitals   07/03/21 0500 07/03/21 0756 07/03/21 0950 07/03/21 1204  BP: (!) 144/98 (!) 153/95  126/64  Pulse: 88 90  89  Resp: 18 18  18   Temp: 98.1 F (36.7 C) 98 F (36.7 C)  97.6 F (36.4 C)  TempSrc: Oral Oral  Oral  SpO2: 100% 99%  99%  Weight:      Height:      PainSc:   0-No pain     Intake/Output Summary (Last 24 hours) at 07/03/2021 1327 Last data filed at 07/03/2021 0950 Gross per 24 hour  Intake 1540 ml  Output 2698 ml  Net -1158 ml    Filed Weights   07/01/21 0015 07/01/21 1155  Weight: 104.3 kg 105.6 kg   Weight change:   Intake/Output from previous day: 07/28 0701 - 07/29 0700 In: 1300 [P.O.:900; I.V.:400] Out: 1598 [Urine:1598] Intake/Output this shift: Total I/O In: 240 [P.O.:240] Out: 1100 [Urine:1100] Filed Weights   07/01/21 0015 07/01/21 1155  Weight: 104.3 kg 105.6 kg   Examination: General exam: AAOx 3, legally blind. HEENT:Oral mucosa moist, Ear/Nose WNL grossly, dentition normal. Respiratory system: bilaterally diminished, no use of accessory muscle Cardiovascular system: S1 & S2 +, No JVD,. Gastrointestinal system: Abdomen soft,NT,ND, BS+ Nervous System:Alert, awake, moving extremities and grossly nonfocal Extremities: no edema,  distal peripheral pulses palpable.  Skin: No rashes,no icterus. MSK: Normal muscle bulk,tone, power  Data Reviewed: I have personally reviewed following labs and imaging studies CBC: Recent Labs  Lab 07/01/21 0100  WBC 6.6  NEUTROABS 4.5  HGB 14.0  HCT 41.3  MCV 91.2  PLT 371    Basic Metabolic Panel: Recent Labs  Lab 07/01/21 0100  NA 138  K 3.6  CL 103  CO2 22  GLUCOSE 148*  BUN 10  CREATININE 0.76  CALCIUM 9.8    GFR: Estimated Creatinine Clearance: 102.9 mL/min (by C-G formula based on SCr of 0.76 mg/dL). Liver Function Tests: Recent Labs  Lab 07/01/21 0100  AST 15  ALT 20  ALKPHOS 47  BILITOT 1.3*  PROT 7.0  ALBUMIN 4.4    No results for input(s): LIPASE, AMYLASE in the last 168 hours. No results for input(s): AMMONIA in the last 168 hours. Coagulation Profile: No results for input(s): INR, PROTIME in the last 168 hours. Cardiac Enzymes: No results for input(s): CKTOTAL, CKMB, CKMBINDEX, TROPONINI in the last 168 hours. BNP (last 3 results) No results for input(s): PROBNP in the last 8760 hours. HbA1C: Recent Labs    07/02/21 0636  HGBA1C 5.1   CBG: No results for input(s): GLUCAP in the last 168 hours. Lipid Profile: Recent Labs    07/02/21 0636  CHOL 226*  HDL 36*  LDLCALC 161*  TRIG 147  CHOLHDL 6.3    Thyroid Function Tests: No results for input(s): TSH, T4TOTAL, FREET4, T3FREE, THYROIDAB in the last 72 hours. Anemia Panel: No results for input(s): VITAMINB12, FOLATE, FERRITIN, TIBC, IRON, RETICCTPCT in the last 72 hours. Sepsis Labs: No results for input(s): PROCALCITON, LATICACIDVEN in the last 168 hours.  Recent Results (from the past 240 hour(s))  Resp Panel by RT-PCR (Flu A&B, Covid) Nasopharyngeal Swab     Status: None   Collection Time: 07/01/21  3:45 AM   Specimen: Nasopharyngeal Swab; Nasopharyngeal(NP) swabs in vial transport medium  Result Value Ref  Range Status   SARS Coronavirus 2 by RT PCR NEGATIVE NEGATIVE  Final    Comment: (NOTE) SARS-CoV-2 target nucleic acids are NOT DETECTED.  The SARS-CoV-2 RNA is generally detectable in upper respiratory specimens during the acute phase of infection. The lowest concentration of SARS-CoV-2 viral copies this assay can detect is 138 copies/mL. A negative result does not preclude SARS-Cov-2 infection and should not be used as the sole basis for treatment or other patient management decisions. A negative result may occur with  improper specimen collection/handling, submission of specimen other than nasopharyngeal swab, presence of viral mutation(s) within the areas targeted by this assay, and inadequate number of viral copies(<138 copies/mL). A negative result must be combined with clinical observations, patient history, and epidemiological information. The expected result is Negative.  Fact Sheet for Patients:  BloggerCourse.comhttps://www.fda.gov/media/152166/download  Fact Sheet for Healthcare Providers:  SeriousBroker.ithttps://www.fda.gov/media/152162/download  This test is no t yet approved or cleared by the [place] FDA and  has been authorized for detection and/or diagnosis of SARS-CoV-2 by FDA under an Emergency Use Authorization (EUA). This EUA will remain  in effect (meaning this test can be used) for the duration of the COVID-19 declaration under Section 564(b)(1) of the Act, 21 U.S.C.section 360bbb-3(b)(1), unless the authorization is terminated  or revoked sooner.       Influenza A by PCR NEGATIVE NEGATIVE Final   Influenza B by PCR NEGATIVE NEGATIVE Final    Comment: (NOTE) The Xpert Xpress SARS-CoV-2/FLU/RSV plus assay is intended as an aid in the diagnosis of influenza from Nasopharyngeal swab specimens and should not be used as a sole basis for treatment. Nasal washings and aspirates are unacceptable for Xpert Xpress SARS-CoV-2/FLU/RSV testing.  Fact Sheet for Patients: BloggerCourse.comhttps://www.fda.gov/media/152166/download  Fact Sheet for Healthcare  Providers: SeriousBroker.ithttps://www.fda.gov/media/152162/download  This test is not yet approved or cleared by the [place] FDA and has been authorized for detection and/or diagnosis of SARS-CoV-2 by FDA under an Emergency Use Authorization (EUA). This EUA will remain in effect (meaning this test can be used) for the duration of the COVID-19 declaration under Section 564(b)(1) of the Act, 21 U.S.C. section 360bbb-3(b)(1), unless the authorization is terminated or revoked.  Performed at [facility], [address], [place], [place] [address]   CSF culture w Gram Stain     Status: None (Preliminary result)   Collection Time: 07/02/21  3:46 PM   Specimen: CSF; Cerebrospinal Fluid  Result Value Ref Range Status   Specimen Description CSF  Final   Special Requests Immunocompromised  Final   Gram Stain   Final    WBC PRESENT,BOTH PMN AND MONONUCLEAR NO ORGANISMS SEEN CYTOSPIN SMEAR    Culture   Final    NO GROWTH < 24 HOURS Performed at [facility], [address] [place], [place] [address]    Report Status PENDING  Incomplete  Culture, fungus without smear     Status: None (Preliminary result)   Collection Time: 07/02/21  3:46 PM   Specimen: CSF; Cerebrospinal Fluid  Result Value Ref Range Status   Specimen Description CSF  Final   Special Requests Immunocompromised  Final   Culture   Final    NO GROWTH < 24 HOURS Performed at [facility], [address] [place], [place] [address]    Report Status PENDING  Incomplete      Radiology Studies: CT ANGIO HEAD W OR WO CONTRAST  Result Date: 07/03/2021 CLINICAL DATA:  Initial evaluation for CNS vasculitis. EXAM:  CT ANGIOGRAPHY HEAD TECHNIQUE: Multidetector CT imaging of the head was performed using the standard protocol during bolus administration of intravenous contrast. Multiplanar CT image reconstructions and MIPs were obtained to evaluate the vascular anatomy. CONTRAST:  6mL OMNIPAQUE IOHEXOL 350  MG/ML SOLN COMPARISON:  Prior MRI and CT from 07/01/2021. FINDINGS: CT HEAD Brain: Previously identified patchy multifocal hypodensities involving the cortical and subcortical aspect of the bilateral parieto-occipital regions again seen, left slightly worse than right. Since prior exam, overall distribution is relatively stable, although affected areas are slightly more prominent and hypodense since previous. No visible hemorrhage evident by CT. No significant regional mass effect or midline shift. No new areas of involvement. The extent of these findings are better characterized on prior brain MRI. Remainder the brain remains otherwise normal in appearance. No other acute infarct or intracranial hemorrhage. No mass lesion or midline shift. No hydrocephalus or extra-axial fluid collection. Vascular: No hyperdense vessel prior to contrast administration. Skull: Scalp soft tissues and calvarium demonstrate no acute finding. Sinuses: Postoperative changes again noted about the globes. Paranasal sinuses remain clear. No mastoid effusion. Other: None. CTA HEAD Anterior circulation: Examination mildly degraded by motion artifact. Visualized distal cervical segments of the internal carotid arteries are widely patent bilaterally. Petrous segments widely patent. Mild atheromatous plaque within the cavernous segments without significant stenosis. ICA termini well perfused. A1 segments patent bilaterally. Left A1 hypoplastic. Normal anterior communicating artery complex. Both anterior cerebral arteries patent to their distal aspects without stenosis. No M1 stenosis or occlusion. Normal MCA bifurcations. Distal MCA branches well perfused and symmetric. No significant beading or vascular irregularity to suggest vasculitis. Posterior circulation: Both V4 segments patent to the vertebrobasilar junction without stenosis. Both PICA origins patent and normal. Basilar patent to its distal aspect without stenosis or significant  irregularity. Superior cerebellar arteries grossly patent at their origins. Both PCAs primarily supplied via the basilar. PCAs well perfused to their distal aspects without significant irregularity, beading, or stenosis. Venous sinuses: Grossly patent allowing for timing the contrast bolus. Anatomic variants: Hypoplastic left A1 segment.  No aneurysm. Review of the MIP images confirms the above findings. IMPRESSION: CT HEAD IMPRESSION: 1. Slightly increased prominence but overall similar distribution of scattered hypodensities involving the bilateral parieto-occipital regions since prior CT from 07/01/2021. No visible hemorrhage by CT. No significant mass effect or midline shift. 2. No other new acute intracranial abnormality. CTA HEAD AND NECK IMPRESSION: 1. No convincing CTA evidence for CNS vasculitis. 2. Mild atheromatous change about the carotid siphons without hemodynamically significant stenosis. Otherwise wide patency of the major arterial intracranial circulation. Electronically Signed   By: Rise Mu M.D.   On: 07/03/2021 04:59   MR Brain W and Wo Contrast  Result Date: 07/01/2021 CLINICAL DATA:  Headache, new or worsening. EXAM: MRI HEAD WITHOUT AND WITH CONTRAST TECHNIQUE: Multiplanar, multiecho pulse sequences of the brain and surrounding structures were obtained without and with intravenous contrast. CONTRAST:  25mL GADAVIST GADOBUTROL 1 MMOL/ML IV SOLN COMPARISON:  Same day CT head. FINDINGS: Brain: There is marked, fairly symmetric abnormal T2/FLAIR hyperintensity involving bilateral parieto-occipital lobes with associated restricted diffusion and areas of acute hemorrhage in the left occipital lobe. Additional patchy areas restricted diffusion and T2/FLAIR hyperintensity and bilateral frontal and parietal lobes and the white matter tracts of the brainstem, including the midbrain, dorsal pons, and medulla. There are patchy areas of enhancement, most conspicuous in the left occipital  lobe. No hydrocephalus. No midline shift. No extra-axial fluid collection. Vascular: Major arterial flow  voids are maintained at the skull base. Skull and upper cervical spine: Normal marrow signal. . Sinuses/Orbits: Postoperative changes of the left globe Other: No mastoid effusions. IMPRESSION: Severe/confluent abnormal T2/FLAIR hyperintense signal in bilateral parieto-occipital white matter and patchy abnormal signal in bilateral frontoparietal white matter and the white matter tracts of the brainstem, detailed above. Associated restricted diffusion in these areas is concerning for acute ischemia with patchy enhancement and acute hemorrhage in the left occipital lobe. 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 Other toxic/metabolic causes of leukoencephalopathy should be considered. PRES is thought less likely given sparing of the cortex. Tumefactive demyelination is also thought less likely. While MELAS (mitochondrial encephalopathy with lactic acidosis and stroke-like episodes) can have a similar appearance it typically occurs in childhood or early adulthood and also typically isn't this symmetric. Findings discussed with Dr. Ophelia Charter Via telephone at 7:09 PM. Electronically Signed   By: Feliberto Harts MD   On: 07/01/2021 19:15     LOS: 2 days   Lanae Boast, MD [facility]  07/03/2021, 1:27 PM

## 2021-07-03 NOTE — Progress Notes (Signed)
Pt arrived back to unit. Pt stable with no needs at this time.

## 2021-07-03 NOTE — Progress Notes (Signed)
Throughout shift pt has voided many times see chart for details. Pt has been bladder scan and still retaining urine, see chart for details. Pt still refusing to have Foley catheter placed at this time. Talked to pt's daughter at bedside about situation. Oncoming nurse Selena Batten, RN given report on pt as well.

## 2021-07-04 MED ORDER — BUTALBITAL-APAP-CAFFEINE 50-325-40 MG PO TABS: 1.0000 | ORAL_TABLET | Freq: Once | ORAL | Status: DC

## 2021-07-04 NOTE — Discharge Summary (Signed)
Physician Discharge Summary  Patient ID: Shelley Reese MRN: [id number] DOB/AGE: 55/17/1967 55 y.o.  Admit date: 07/01/2021 Discharge date: 07/04/2021  Admission Diagnoses:  Discharge Diagnoses:  Principal Problem:   Posterior reversible encephalopathy syndrome Active Problems:   Lupus (systemic lupus erythematosus) (HCC)   Visual impairment severe bilaterally   Class 1 obesity due to excess calories with body mass index (BMI) of 34.0 to 34.9 in adult   Discharged Condition: stable  Hospital Course:   55 year old female with past medical history of SLE vasculitis/uveitis, remote DVT history of Xarelto presented with complaints of headache nausea vomiting and weakness with associated severe headache.   Upon evaluation in a local urgent care clinic patient was sent to [facility] emergency department for evaluation.  Upon evaluation in the emergency department CT imaging of the head revealed features concerning for PRES and therefore ER provider consulted neurology for evaluation.  MRI was recommended as well as recommendations of holding azathioprine and Xarelto.  The hospitalist group was additionally called to assess the patient for admission to the hospital.  MRI brain with and without with confluent T2/FL AIR hyperintense lesions in the right occipital white matter, patchy abnormal signal in the bilateral frontotemporal white matter and white matter tracts of the brainstem, associated restricted diffusion in this area concerning for acute ischemia with patchy enhancement and possible acute hemorrhage in the left occipital lobe not seen very well on the susceptibility images as per neurology this findings compatible with acute severe leukoencephalopathy, which is nonspecific but given history of lupus could be secondary to lupus cerebritis/vasculitis, lymphoma or an atypical presentation of PRES.   Per neurology Xarelto was held with eventual plan to perform a large-volume CSF tap  with flow cytometry 48 hours afterwards.  Neurology also recommended transferring the patient to [facility] where the patient received her usual care in addition to availability of inpatient rheumatology.  A bed became available on 7/29 and patient was transferred to [facility] health in stable condition..  Consults: neurology   Disposition: Discharge disposition: [facility] Not Defined        Allergies as of 07/04/2021       Reactions   Amoxicillin Other (See Comments)   GI Upset Has patient had a PCN reaction causing immediate rash, facial/tongue/throat swelling, SOB or lightheadedness with hypotension: No Has patient had a PCN reaction causing severe rash involving mucus membranes or skin necrosis: No Has patient had a PCN reaction that required hospitalization: No Has patient had a PCN reaction occurring within the last 10 years: Yes--GI upset occurred within 10 years. If all of the above answers are "NO", then may proceed with Cephalosporin use.        Medication List     STOP taking these medications    azaTHIOprine 50 MG tablet Commonly known as: IMURAN   predniSONE 5 MG tablet Commonly known as: DELTASONE   Xarelto 20 MG Tabs tablet Generic drug: rivaroxaban       TAKE these medications    acetaminophen 500 MG tablet Commonly known as: TYLENOL Take 1,000 mg by mouth every 6 (six) hours as needed for mild pain.   cetirizine 10 MG tablet Commonly known as: ZYRTEC Take 10 mg by mouth daily as needed for allergies.   diphenhydrAMINE 25 mg capsule Commonly known as: BENADRYL Take 25 mg by mouth daily as needed for itching or sleep.   dorzolamide-timolol 22.3-6.8 MG/ML ophthalmic solution Commonly known as: COSOPT Place 1  drop into the right eye 2 (two) times daily.   ibuprofen 200 MG tablet Commonly known as: ADVIL Take 400 mg by mouth daily as needed for pain.   omeprazole 40 MG capsule Commonly  known as: PRILOSEC Take 40 mg by mouth every morning.   PARoxetine 20 MG tablet Commonly known as: PAXIL Take 20 mg by mouth daily.   prednisoLONE acetate 1 % ophthalmic suspension Commonly known as: PRED FORTE Place 1 drop into the right eye every 4 (four) hours.   sulfamethoxazole-trimethoprim 800-160 MG tablet Commonly known as: BACTRIM DS Take 1 tablet by mouth 3 (three) times a week. No specific days        Follow-up Information     Koirala, Dibas, MD Follow up in 1 week(s).   Specialty: Family Medicine Contact information: [address] [place] [address] [phone]                 Signed: Marinda Elk 07/04/2021, 6:33 AM

## 2021-07-05 LAB — CSF CULTURE W GRAM STAIN: Culture: NO GROWTH

## 2021-07-06 LAB — MISC LABCORP TEST (SEND OUT): Labcorp test code: 9985

## 2021-07-07 LAB — OLIGOCLONAL BANDS, CSF + SERM

## 2021-07-07 LAB — IGG CSF INDEX
Albumin CSF-mCnc: 78 mg/dL — ABNORMAL HIGH (ref 8–37)
Albumin: 4 g/dL (ref 3.8–4.9)
CSF IgG Index: 0.8 — ABNORMAL HIGH (ref 0.0–0.7)
IgG (Immunoglobin G), Serum: 683 mg/dL (ref 586–1602)
IgG, CSF: 11.2 mg/dL — ABNORMAL HIGH (ref 0.0–6.7)
IgG/Alb Ratio, CSF: 0.14 (ref 0.00–0.25)

## 2021-07-07 LAB — ANGIOTENSIN CONVERTING ENZYME, CSF: Angio Convert Enzyme: 3.5 U/L — ABNORMAL HIGH (ref 0.0–3.1)

## 2021-07-07 LAB — MYELIN BASIC PROTEIN, CSF: Myelin Basic Protein: 12.8 ng/mL — ABNORMAL HIGH (ref 0.0–3.7)

## 2021-07-23 LAB — CULTURE, FUNGUS WITHOUT SMEAR

## 2021-08-06 DEATH — deceased
